# Patient Record
Sex: Female | Born: 1965
Health system: Southern US, Community
[De-identification: ages and names within clinical notes are randomized; demographics above are authoritative.]

## PROBLEM LIST (undated history)

## (undated) DIAGNOSIS — E079 Disorder of thyroid, unspecified: Secondary | ICD-10-CM

## (undated) DIAGNOSIS — D692 Other nonthrombocytopenic purpura: Secondary | ICD-10-CM

## (undated) DIAGNOSIS — I2510 Atherosclerotic heart disease of native coronary artery without angina pectoris: Secondary | ICD-10-CM

## (undated) DIAGNOSIS — E039 Hypothyroidism, unspecified: Secondary | ICD-10-CM

## (undated) DIAGNOSIS — I739 Peripheral vascular disease, unspecified: Secondary | ICD-10-CM

## (undated) DIAGNOSIS — I10 Essential (primary) hypertension: Secondary | ICD-10-CM

## (undated) DIAGNOSIS — E785 Hyperlipidemia, unspecified: Secondary | ICD-10-CM

## (undated) DIAGNOSIS — E1151 Type 2 diabetes mellitus with diabetic peripheral angiopathy without gangrene: Secondary | ICD-10-CM

## (undated) DIAGNOSIS — N289 Disorder of kidney and ureter, unspecified: Secondary | ICD-10-CM

## (undated) DIAGNOSIS — R7989 Other specified abnormal findings of blood chemistry: Secondary | ICD-10-CM

## (undated) DIAGNOSIS — E782 Mixed hyperlipidemia: Secondary | ICD-10-CM

## (undated) HISTORY — DX: Atherosclerotic heart disease of native coronary artery without angina pectoris: I25.10

## (undated) HISTORY — PX: HIP SURGERY: SHX245

## (undated) HISTORY — DX: Disorder of kidney and ureter, unspecified: N28.9

## (undated) HISTORY — PX: KNEE SURGERY: SHX244

## (undated) HISTORY — DX: Type 2 diabetes mellitus with diabetic peripheral angiopathy without gangrene: E11.51

## (undated) HISTORY — DX: Other specified abnormal findings of blood chemistry: R79.89

## (undated) HISTORY — DX: Mixed hyperlipidemia: E78.2

## (undated) HISTORY — DX: Peripheral vascular disease, unspecified: I73.9

## (undated) HISTORY — DX: Other nonthrombocytopenic purpura: D69.2

## (undated) HISTORY — PX: TUBAL LIGATION: SHX77

## (undated) HISTORY — DX: Hypothyroidism, unspecified: E03.9

---

## 1994-08-08 HISTORY — PX: HIP SURGERY: SHX245

## 2016-12-27 DIAGNOSIS — R05 Cough: Secondary | ICD-10-CM | POA: Diagnosis not present

## 2016-12-27 DIAGNOSIS — R062 Wheezing: Secondary | ICD-10-CM | POA: Diagnosis not present

## 2017-11-21 DIAGNOSIS — E782 Mixed hyperlipidemia: Secondary | ICD-10-CM | POA: Diagnosis not present

## 2017-11-21 DIAGNOSIS — E039 Hypothyroidism, unspecified: Secondary | ICD-10-CM | POA: Diagnosis not present

## 2017-11-28 DIAGNOSIS — E039 Hypothyroidism, unspecified: Secondary | ICD-10-CM | POA: Diagnosis not present

## 2017-11-28 DIAGNOSIS — E782 Mixed hyperlipidemia: Secondary | ICD-10-CM | POA: Diagnosis not present

## 2018-01-16 DIAGNOSIS — E039 Hypothyroidism, unspecified: Secondary | ICD-10-CM | POA: Diagnosis not present

## 2018-03-29 DIAGNOSIS — E782 Mixed hyperlipidemia: Secondary | ICD-10-CM | POA: Diagnosis not present

## 2018-03-29 DIAGNOSIS — E039 Hypothyroidism, unspecified: Secondary | ICD-10-CM | POA: Diagnosis not present

## 2018-07-10 DIAGNOSIS — E782 Mixed hyperlipidemia: Secondary | ICD-10-CM | POA: Diagnosis not present

## 2018-07-10 DIAGNOSIS — E039 Hypothyroidism, unspecified: Secondary | ICD-10-CM | POA: Diagnosis not present

## 2018-07-10 DIAGNOSIS — I350 Nonrheumatic aortic (valve) stenosis: Secondary | ICD-10-CM | POA: Diagnosis not present

## 2019-05-11 ENCOUNTER — Encounter (HOSPITAL_COMMUNITY)
Admission: EM | Disposition: A | Discharge status: Home / Self Care | Payer: Self-pay | Source: Home / Self Care | Attending: Cardiology

## 2019-05-11 ENCOUNTER — Encounter (HOSPITAL_COMMUNITY): Payer: Self-pay | Admitting: *Deleted

## 2019-05-11 ENCOUNTER — Inpatient Hospital Stay (HOSPITAL_COMMUNITY)
Admission: EM | Admit: 2019-05-11 | Discharge: 2019-05-21 | DRG: 234 | Disposition: A | Discharge status: Home / Self Care | Payer: 59 | Attending: Cardiothoracic Surgery | Admitting: Cardiothoracic Surgery

## 2019-05-11 ENCOUNTER — Other Ambulatory Visit: Payer: Self-pay

## 2019-05-11 DIAGNOSIS — E782 Mixed hyperlipidemia: Secondary | ICD-10-CM | POA: Diagnosis not present

## 2019-05-11 DIAGNOSIS — E785 Hyperlipidemia, unspecified: Secondary | ICD-10-CM | POA: Diagnosis present

## 2019-05-11 DIAGNOSIS — Z951 Presence of aortocoronary bypass graft: Secondary | ICD-10-CM | POA: Diagnosis not present

## 2019-05-11 DIAGNOSIS — R0789 Other chest pain: Secondary | ICD-10-CM | POA: Diagnosis present

## 2019-05-11 DIAGNOSIS — Z8249 Family history of ischemic heart disease and other diseases of the circulatory system: Secondary | ICD-10-CM | POA: Diagnosis not present

## 2019-05-11 DIAGNOSIS — I25110 Atherosclerotic heart disease of native coronary artery with unstable angina pectoris: Secondary | ICD-10-CM | POA: Diagnosis present

## 2019-05-11 DIAGNOSIS — I25118 Atherosclerotic heart disease of native coronary artery with other forms of angina pectoris: Secondary | ICD-10-CM | POA: Diagnosis not present

## 2019-05-11 DIAGNOSIS — J9811 Atelectasis: Secondary | ICD-10-CM

## 2019-05-11 DIAGNOSIS — R000 Tachycardia, unspecified: Secondary | ICD-10-CM | POA: Diagnosis not present

## 2019-05-11 DIAGNOSIS — I10 Essential (primary) hypertension: Secondary | ICD-10-CM

## 2019-05-11 DIAGNOSIS — Z79890 Hormone replacement therapy (postmenopausal): Secondary | ICD-10-CM | POA: Diagnosis not present

## 2019-05-11 DIAGNOSIS — I2102 ST elevation (STEMI) myocardial infarction involving left anterior descending coronary artery: Principal | ICD-10-CM | POA: Diagnosis present

## 2019-05-11 DIAGNOSIS — I2510 Atherosclerotic heart disease of native coronary artery without angina pectoris: Secondary | ICD-10-CM | POA: Diagnosis not present

## 2019-05-11 DIAGNOSIS — D62 Acute posthemorrhagic anemia: Secondary | ICD-10-CM | POA: Diagnosis not present

## 2019-05-11 DIAGNOSIS — I2109 ST elevation (STEMI) myocardial infarction involving other coronary artery of anterior wall: Secondary | ICD-10-CM

## 2019-05-11 DIAGNOSIS — Z87891 Personal history of nicotine dependence: Secondary | ICD-10-CM

## 2019-05-11 DIAGNOSIS — Z20828 Contact with and (suspected) exposure to other viral communicable diseases: Secondary | ICD-10-CM | POA: Diagnosis present

## 2019-05-11 DIAGNOSIS — R7303 Prediabetes: Secondary | ICD-10-CM | POA: Diagnosis present

## 2019-05-11 DIAGNOSIS — Z01818 Encounter for other preprocedural examination: Secondary | ICD-10-CM

## 2019-05-11 DIAGNOSIS — Z01810 Encounter for preprocedural cardiovascular examination: Secondary | ICD-10-CM | POA: Diagnosis not present

## 2019-05-11 DIAGNOSIS — E8770 Fluid overload, unspecified: Secondary | ICD-10-CM | POA: Diagnosis not present

## 2019-05-11 DIAGNOSIS — E039 Hypothyroidism, unspecified: Secondary | ICD-10-CM | POA: Diagnosis not present

## 2019-05-11 DIAGNOSIS — Z7722 Contact with and (suspected) exposure to environmental tobacco smoke (acute) (chronic): Secondary | ICD-10-CM | POA: Diagnosis present

## 2019-05-11 DIAGNOSIS — I5021 Acute systolic (congestive) heart failure: Secondary | ICD-10-CM | POA: Diagnosis not present

## 2019-05-11 DIAGNOSIS — I249 Acute ischemic heart disease, unspecified: Secondary | ICD-10-CM

## 2019-05-11 HISTORY — DX: Disorder of thyroid, unspecified: E07.9

## 2019-05-11 HISTORY — DX: Essential (primary) hypertension: I10

## 2019-05-11 HISTORY — PX: LEFT HEART CATH AND CORONARY ANGIOGRAPHY: CATH118249

## 2019-05-11 HISTORY — DX: ST elevation (STEMI) myocardial infarction involving left anterior descending coronary artery: I21.02

## 2019-05-11 HISTORY — DX: Hyperlipidemia, unspecified: E78.5

## 2019-05-11 LAB — CBC WITH DIFFERENTIAL/PLATELET
Abs Immature Granulocytes: 0.05 10*3/uL (ref 0.00–0.07)
Basophils Absolute: 0 10*3/uL (ref 0.0–0.1)
Basophils Relative: 0 %
Eosinophils Absolute: 0 10*3/uL (ref 0.0–0.5)
Eosinophils Relative: 0 %
HCT: 42.5 % (ref 36.0–46.0)
Hemoglobin: 14.3 g/dL (ref 12.0–15.0)
Immature Granulocytes: 1 %
Lymphocytes Relative: 17 %
Lymphs Abs: 1.8 10*3/uL (ref 0.7–4.0)
MCH: 31.8 pg (ref 26.0–34.0)
MCHC: 33.6 g/dL (ref 30.0–36.0)
MCV: 94.7 fL (ref 80.0–100.0)
Monocytes Absolute: 0.4 10*3/uL (ref 0.1–1.0)
Monocytes Relative: 4 %
Neutro Abs: 8.7 10*3/uL — ABNORMAL HIGH (ref 1.7–7.7)
Neutrophils Relative %: 78 %
Platelets: 340 10*3/uL (ref 150–400)
RBC: 4.49 MIL/uL (ref 3.87–5.11)
RDW: 13.8 % (ref 11.5–15.5)
WBC: 11 10*3/uL — ABNORMAL HIGH (ref 4.0–10.5)
nRBC: 0 % (ref 0.0–0.2)

## 2019-05-11 LAB — COMPREHENSIVE METABOLIC PANEL
ALT: 45 U/L — ABNORMAL HIGH (ref 0–44)
AST: 158 U/L — ABNORMAL HIGH (ref 15–41)
Albumin: 4.3 g/dL (ref 3.5–5.0)
Alkaline Phosphatase: 68 U/L (ref 38–126)
Anion gap: 11 (ref 5–15)
BUN: 14 mg/dL (ref 6–20)
CO2: 22 mmol/L (ref 22–32)
Calcium: 9.8 mg/dL (ref 8.9–10.3)
Chloride: 107 mmol/L (ref 98–111)
Creatinine, Ser: 0.75 mg/dL (ref 0.44–1.00)
GFR calc Af Amer: >60 mL/min (ref >60)
GFR calc non Af Amer: >60 mL/min (ref >60)
Glucose, Bld: 108 mg/dL — ABNORMAL HIGH (ref 70–99)
Potassium: 4 mmol/L (ref 3.5–5.1)
Sodium: 140 mmol/L (ref 135–145)
Total Bilirubin: 0.3 mg/dL (ref 0.3–1.2)
Total Protein: 7.2 g/dL (ref 6.5–8.1)

## 2019-05-11 LAB — CBC
HCT: 42 % (ref 36.0–46.0)
Hemoglobin: 13.6 g/dL (ref 12.0–15.0)
MCH: 31.1 pg (ref 26.0–34.0)
MCHC: 32.4 g/dL (ref 30.0–36.0)
MCV: 95.9 fL (ref 80.0–100.0)
Platelets: 352 10*3/uL (ref 150–400)
RBC: 4.38 MIL/uL (ref 3.87–5.11)
RDW: 14 % (ref 11.5–15.5)
WBC: 9.9 10*3/uL (ref 4.0–10.5)
nRBC: 0 % (ref 0.0–0.2)

## 2019-05-11 LAB — LIPID PANEL
Cholesterol: 93 mg/dL (ref 0–200)
HDL: 25 mg/dL — ABNORMAL LOW (ref >40)
LDL Cholesterol: 54 mg/dL (ref 0–99)
Total CHOL/HDL Ratio: 3.7 RATIO
Triglycerides: 70 mg/dL (ref <150)
VLDL: 14 mg/dL (ref 0–40)

## 2019-05-11 LAB — TSH: TSH: 0.941 u[IU]/mL (ref 0.350–4.500)

## 2019-05-11 LAB — HIV ANTIBODY (ROUTINE TESTING W REFLEX): HIV Screen 4th Generation wRfx: NONREACTIVE

## 2019-05-11 LAB — BASIC METABOLIC PANEL
Anion gap: 10 (ref 5–15)
BUN: 13 mg/dL (ref 6–20)
CO2: 23 mmol/L (ref 22–32)
Calcium: 9.6 mg/dL (ref 8.9–10.3)
Chloride: 106 mmol/L (ref 98–111)
Creatinine, Ser: 0.68 mg/dL (ref 0.44–1.00)
GFR calc Af Amer: >60 mL/min (ref >60)
GFR calc non Af Amer: >60 mL/min (ref >60)
Glucose, Bld: 122 mg/dL — ABNORMAL HIGH (ref 70–99)
Potassium: 4.1 mmol/L (ref 3.5–5.1)
Sodium: 139 mmol/L (ref 135–145)

## 2019-05-11 LAB — I-STAT BETA HCG BLOOD, ED (MC, WL, AP ONLY): I-stat hCG, quantitative: <5 m[IU]/mL (ref <5)

## 2019-05-11 LAB — MAGNESIUM: Magnesium: 1.8 mg/dL (ref 1.7–2.4)

## 2019-05-11 LAB — PROTIME-INR
INR: 1.1 (ref 0.8–1.2)
Prothrombin Time: 13.6 seconds (ref 11.4–15.2)

## 2019-05-11 LAB — APTT: aPTT: 32 seconds (ref 24–36)

## 2019-05-11 LAB — HEMOGLOBIN A1C
Hgb A1c MFr Bld: 6.2 % — ABNORMAL HIGH (ref 4.8–5.6)
Mean Plasma Glucose: 131.24 mg/dL

## 2019-05-11 LAB — MRSA PCR SCREENING: MRSA by PCR: NEGATIVE

## 2019-05-11 LAB — BRAIN NATRIURETIC PEPTIDE: B Natriuretic Peptide: 265.6 pg/mL — ABNORMAL HIGH (ref 0.0–100.0)

## 2019-05-11 LAB — TROPONIN I (HIGH SENSITIVITY)
Troponin I (High Sensitivity): >27000 ng/L (ref <18)
Troponin I (High Sensitivity): >27000 ng/L (ref <18)

## 2019-05-11 LAB — SARS CORONAVIRUS 2 BY RT PCR (HOSPITAL ORDER, PERFORMED IN ~~LOC~~ HOSPITAL LAB): SARS Coronavirus 2: NEGATIVE

## 2019-05-11 SURGERY — LEFT HEART CATH AND CORONARY ANGIOGRAPHY
Anesthesia: LOCAL

## 2019-05-11 MED ORDER — FENOFIBRATE 54 MG PO TABS
54.0000 mg | ORAL_TABLET | Freq: Every day | ORAL | Status: DC
  Administered 2019-05-11 – 2019-05-16 (×6): 54 mg via ORAL
  Filled 2019-05-11 (×6): qty 1

## 2019-05-11 MED ORDER — IOHEXOL 350 MG/ML SOLN
INTRAVENOUS | Status: DC | PRN
  Administered 2019-05-11: 15:00:00 100 mL via INTRA_ARTERIAL

## 2019-05-11 MED ORDER — MUPIROCIN 2 % EX OINT: 1.0000 "application " | TOPICAL_OINTMENT | Freq: Two times a day (BID) | CUTANEOUS | Status: DC

## 2019-05-11 MED ORDER — NITROGLYCERIN 1 MG/10 ML FOR IR/CATH LAB
INTRA_ARTERIAL | Status: AC
  Filled 2019-05-11: qty 10

## 2019-05-11 MED ORDER — VERAPAMIL HCL 2.5 MG/ML IV SOLN
INTRAVENOUS | Status: DC | PRN
  Administered 2019-05-11: 15:00:00 10 mL via INTRA_ARTERIAL

## 2019-05-11 MED ORDER — CARVEDILOL 6.25 MG PO TABS
6.2500 mg | ORAL_TABLET | Freq: Two times a day (BID) | ORAL | Status: DC
  Administered 2019-05-11 – 2019-05-16 (×11): 6.25 mg via ORAL
  Filled 2019-05-11 (×12): qty 1

## 2019-05-11 MED ORDER — HEPARIN (PORCINE) IN NACL 1000-0.9 UT/500ML-% IV SOLN
INTRAVENOUS | Status: AC
  Filled 2019-05-11: qty 1000

## 2019-05-11 MED ORDER — ASPIRIN 81 MG PO CHEW
81.0000 mg | CHEWABLE_TABLET | Freq: Every day | ORAL | Status: DC
  Administered 2019-05-12 – 2019-05-16 (×5): 81 mg via ORAL
  Filled 2019-05-11 (×5): qty 1

## 2019-05-11 MED ORDER — HEPARIN SODIUM (PORCINE) 1000 UNIT/ML IJ SOLN
INTRAMUSCULAR | Status: DC | PRN
  Administered 2019-05-11: 3000 [IU] via INTRAVENOUS

## 2019-05-11 MED ORDER — EZETIMIBE 10 MG PO TABS
10.0000 mg | ORAL_TABLET | Freq: Every day | ORAL | Status: DC
  Administered 2019-05-11 – 2019-05-21 (×10): 10 mg via ORAL
  Filled 2019-05-11 (×10): qty 1

## 2019-05-11 MED ORDER — FENTANYL CITRATE (PF) 100 MCG/2ML IJ SOLN
INTRAMUSCULAR | Status: AC
  Filled 2019-05-11: qty 2

## 2019-05-11 MED ORDER — NITROGLYCERIN IN D5W 200-5 MCG/ML-% IV SOLN
INTRAVENOUS | Status: AC | PRN
  Administered 2019-05-11: 5 ug/min via INTRAVENOUS

## 2019-05-11 MED ORDER — SODIUM CHLORIDE 0.9 % IV SOLN
INTRAVENOUS | Status: DC
  Administered 2019-05-11 – 2019-05-17 (×2): via INTRAVENOUS

## 2019-05-11 MED ORDER — HEPARIN SODIUM (PORCINE) 1000 UNIT/ML IJ SOLN
INTRAMUSCULAR | Status: AC
  Filled 2019-05-11: qty 1

## 2019-05-11 MED ORDER — ASPIRIN 81 MG PO CHEW: 324.0000 mg | CHEWABLE_TABLET | Freq: Once | ORAL | Status: DC

## 2019-05-11 MED ORDER — LIDOCAINE HCL (PF) 1 % IJ SOLN
INTRAMUSCULAR | Status: DC | PRN
  Administered 2019-05-11: 5 mL via SUBCUTANEOUS

## 2019-05-11 MED ORDER — LIDOCAINE HCL (PF) 1 % IJ SOLN
INTRAMUSCULAR | Status: AC
  Filled 2019-05-11: qty 30

## 2019-05-11 MED ORDER — SODIUM CHLORIDE 0.9 % IV SOLN: 250.0000 mL | INTRAVENOUS | Status: DC | PRN

## 2019-05-11 MED ORDER — HEPARIN (PORCINE) IN NACL 1000-0.9 UT/500ML-% IV SOLN
INTRAVENOUS | Status: DC | PRN
  Administered 2019-05-11 (×2): 500 mL

## 2019-05-11 MED ORDER — SODIUM CHLORIDE 0.9% FLUSH: 3.0000 mL | Freq: Once | INTRAVENOUS | Status: DC

## 2019-05-11 MED ORDER — NITROGLYCERIN 0.4 MG SL SUBL
0.4000 mg | SUBLINGUAL_TABLET | SUBLINGUAL | Status: DC | PRN
  Administered 2019-05-11: 0.4 mg via SUBLINGUAL
  Filled 2019-05-11: qty 1

## 2019-05-11 MED ORDER — MIDAZOLAM HCL 2 MG/2ML IJ SOLN
INTRAMUSCULAR | Status: AC
  Filled 2019-05-11: qty 2

## 2019-05-11 MED ORDER — SODIUM CHLORIDE 0.9% FLUSH: 3.0000 mL | INTRAVENOUS | Status: DC | PRN

## 2019-05-11 MED ORDER — HEPARIN (PORCINE) 25000 UT/250ML-% IV SOLN
1250.0000 [IU]/h | INTRAVENOUS | Status: DC
  Administered 2019-05-11: 850 [IU]/h via INTRAVENOUS
  Administered 2019-05-13 – 2019-05-15 (×3): 1250 [IU]/h via INTRAVENOUS
  Filled 2019-05-11 (×6): qty 250

## 2019-05-11 MED ORDER — SODIUM CHLORIDE 0.9% FLUSH
3.0000 mL | Freq: Two times a day (BID) | INTRAVENOUS | Status: DC
  Administered 2019-05-12 – 2019-05-16 (×6): 3 mL via INTRAVENOUS

## 2019-05-11 MED ORDER — LEVOTHYROXINE SODIUM 100 MCG PO TABS
100.0000 ug | ORAL_TABLET | Freq: Every day | ORAL | Status: DC
  Administered 2019-05-12 – 2019-05-21 (×9): 100 ug via ORAL
  Filled 2019-05-11 (×9): qty 1

## 2019-05-11 MED ORDER — ACETAMINOPHEN 325 MG PO TABS: 650.0000 mg | ORAL_TABLET | ORAL | Status: DC | PRN

## 2019-05-11 MED ORDER — ASPIRIN EC 81 MG PO TBEC: 81.0000 mg | DELAYED_RELEASE_TABLET | Freq: Every day | ORAL | Status: DC

## 2019-05-11 MED ORDER — CHLORHEXIDINE GLUCONATE CLOTH 2 % EX PADS
6.0000 | MEDICATED_PAD | Freq: Every day | CUTANEOUS | Status: DC
  Administered 2019-05-11 – 2019-05-19 (×6): 6 via TOPICAL

## 2019-05-11 MED ORDER — ONDANSETRON HCL 4 MG/2ML IJ SOLN
4.0000 mg | Freq: Four times a day (QID) | INTRAMUSCULAR | Status: DC | PRN
  Administered 2019-05-17: 13:00:00 4 mg via INTRAVENOUS

## 2019-05-11 MED ORDER — NITROGLYCERIN 0.4 MG SL SUBL: 0.4000 mg | SUBLINGUAL_TABLET | SUBLINGUAL | Status: DC | PRN

## 2019-05-11 MED ORDER — ATORVASTATIN CALCIUM 80 MG PO TABS
80.0000 mg | ORAL_TABLET | Freq: Every day | ORAL | Status: DC
  Administered 2019-05-11 – 2019-05-20 (×10): 80 mg via ORAL
  Filled 2019-05-11 (×9): qty 1

## 2019-05-11 MED ORDER — NITROGLYCERIN IN D5W 200-5 MCG/ML-% IV SOLN
0.0000 ug/min | INTRAVENOUS | Status: DC
  Administered 2019-05-11: 16:00:00 5 ug/min via INTRAVENOUS

## 2019-05-11 MED ORDER — HEPARIN SODIUM (PORCINE) 5000 UNIT/ML IJ SOLN
60.0000 [IU]/kg | Freq: Once | INTRAMUSCULAR | Status: AC
  Administered 2019-05-11: 14:00:00 3800 [IU] via INTRAVENOUS
  Filled 2019-05-11: qty 1

## 2019-05-11 MED ORDER — SODIUM CHLORIDE 0.9 % WEIGHT BASED INFUSION
1.0000 mL/kg/h | INTRAVENOUS | Status: DC
  Administered 2019-05-11: 16:00:00 1 mL/kg/h via INTRAVENOUS

## 2019-05-11 SURGICAL SUPPLY — 18 items
CATH 5FR JL3.5 JR4 ANG PIG MP (CATHETERS) ×2 IMPLANT
CATH LAUNCHER 5F JL3 (CATHETERS) ×1 IMPLANT
CATH LAUNCHER 5F RADL (CATHETERS) ×1 IMPLANT
CATH VISTA GUIDE 6FR XBLAD3.5 (CATHETERS) ×2 IMPLANT
CATHETER LAUNCHER 5F JL3 (CATHETERS) ×2
CATHETER LAUNCHER 5F RADL (CATHETERS) ×2
COVER PRB 48X5XTLSCP FOLD TPE (BAG) ×1 IMPLANT
COVER PROBE 5X48 (BAG) ×1
DEVICE RAD COMP TR BAND LRG (VASCULAR PRODUCTS) ×2 IMPLANT
GLIDESHEATH SLEND SS 6F .021 (SHEATH) ×2 IMPLANT
GUIDEWIRE INQWIRE 1.5J.035X260 (WIRE) ×1 IMPLANT
INQWIRE 1.5J .035X260CM (WIRE) ×2
KIT ENCORE 26 ADVANTAGE (KITS) ×2 IMPLANT
KIT HEART LEFT (KITS) ×2 IMPLANT
PACK CARDIAC CATHETERIZATION (CUSTOM PROCEDURE TRAY) ×2 IMPLANT
SYR MEDRAD MARK 7 150ML (SYRINGE) ×2 IMPLANT
TRANSDUCER W/STOPCOCK (MISCELLANEOUS) ×2 IMPLANT
TUBING CIL FLEX 10 FLL-RA (TUBING) ×2 IMPLANT

## 2019-05-11 NOTE — Plan of Care (Signed)

## 2019-05-11 NOTE — ED Triage Notes (Signed)
Pt began having chest pain radiating to back on Thurs, but MD could not see her and pain resolved.  Pain increased today and radiated to L arm.  Seen at [facility] and given 325 asa and 3 nitro that resolved pain.

## 2019-05-11 NOTE — ED Provider Notes (Signed)
[facility] EMERGENCY DEPARTMENT Provider Note   CSN: [id number] Arrival date & time: 05/11/19  1242     History   Chief Complaint Chief Complaint  Patient presents with  . Chest Pain    HPI Maria Zuniga is a 53 y.o. female with a history of hypertension, hyperlipidemia, significant family history of cardiac disease, former smoker quit 2 years ago, presented to emergency department with chest pain left arm pain.  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  She said the pain was persistent and so she went to urgent care, where she was given full dose aspirin as well as sublingual nitroglycerin, and her pain thereafter immediately resolved.  She reports has been having left shoulder pain for approximately 2 weeks prior to this.   Here in the emergency department she is completely asymptomatic.  She reports she has not been seen by a heart doctor in many years.  She does not recall any recent stress test.  She has no known drug allergies.     HPI  Past Medical History:  Diagnosis Date  . Hyperlipidemia   . Hypertension   . Thyroid disease     Patient Active Problem List   Diagnosis Date Noted  . STEMI involving left anterior descending coronary artery (HCC) 05/11/2019  . Hyperlipidemia 05/11/2019  . HTN (hypertension) 05/11/2019  . Hypothyroidism 05/11/2019    Past Surgical History:  Procedure Laterality Date  . HIP SURGERY    . KNEE SURGERY    . TUBAL LIGATION       OB History   No obstetric history on file.      Home Medications    Prior to Admission medications   Medication Sig Start Date End Date Taking? Authorizing Provider  atorvastatin (LIPITOR) 40 MG tablet Take 40 mg by mouth daily.   Yes [provider]  ezetimibe (ZETIA) 10 MG tablet Take 10 mg by mouth daily.   Yes [provider]  fenofibrate 54 MG  tablet Take 54 mg by mouth daily.   Yes [provider]  levothyroxine (SYNTHROID) 100 MCG tablet Take 100 mcg by mouth daily before breakfast.   Yes [provider]  furosemide (LASIX) 20 MG tablet Take 20 mg by mouth daily as needed for fluid or edema.    [provider]    Family History Family History  Problem Relation Age of Onset  . CAD Mother   . Heart attack Brother   . Heart disease Maternal Grandmother   . Heart disease Maternal Grandfather   . Heart disease Paternal Grandmother     Social History Social History   Tobacco Use  . Smoking status: Former Smoker    Types: Cigarettes    Start date: 05/10/2018  . Smokeless tobacco: Never Used  Substance Use Topics  . Alcohol use: Not Currently  . Drug use: Never     Allergies   Patient has no known allergies.   Review of Systems Review of Systems  Constitutional: Negative for chills and fever.  HENT: Negative for ear pain and sore throat.   Respiratory: Negative for cough and shortness of breath.   Cardiovascular: Positive for chest pain. Negative for palpitations.  Gastrointestinal: Negative for abdominal pain and vomiting.  Genitourinary: Negative for hematuria.  Musculoskeletal: Positive for arthralgias, back pain, joint swelling and myalgias.  Skin: Negative for  color change and rash.  Neurological: Negative for dizziness, syncope and light-headedness.  All other systems reviewed and are negative.    Physical Exam Updated Vital Signs BP 124/70   Pulse 96   Temp 99 F (37.2 C) (Oral)   Resp (!) 21   Ht 5\' 3"  (1.6 m)   Wt 63.5 kg   SpO2 94%   BMI 24.80 kg/m   Physical Exam Vitals signs and nursing note reviewed.  Constitutional:      General: She is not in acute distress.    Appearance: She is well-developed.  HENT:     Head: Normocephalic and atraumatic.  Eyes:     Conjunctiva/sclera: Conjunctivae normal.  Neck:     Musculoskeletal: Neck supple.  Cardiovascular:      Rate and Rhythm: Normal rate and regular rhythm.     Heart sounds: Normal heart sounds.  Pulmonary:     Effort: Pulmonary effort is normal. No respiratory distress.     Breath sounds: Normal breath sounds.  Abdominal:     Palpations: Abdomen is soft.     Tenderness: There is no abdominal tenderness.  Skin:    General: Skin is warm and dry.  Neurological:     Mental Status: She is alert.      ED Treatments / Results  Labs (all labs ordered are listed, but only abnormal results are displayed) Labs Reviewed  BASIC METABOLIC PANEL - Abnormal; Notable for the following components:      Result Value   Glucose, Bld 122 (*)    All other components within normal limits  CBC WITH DIFFERENTIAL/PLATELET - Abnormal; Notable for the following components:   WBC 11.0 (*)    Neutro Abs 8.7 (*)    All other components within normal limits  COMPREHENSIVE METABOLIC PANEL - Abnormal; Notable for the following components:   Glucose, Bld 108 (*)    AST 158 (*)    ALT 45 (*)    All other components within normal limits  LIPID PANEL - Abnormal; Notable for the following components:   HDL 25 (*)    All other components within normal limits  HEMOGLOBIN A1C - Abnormal; Notable for the following components:   Hgb A1c MFr Bld 6.2 (*)    All other components within normal limits  BRAIN NATRIURETIC PEPTIDE - Abnormal; Notable for the following components:   B Natriuretic Peptide 265.6 (*)    All other components within normal limits  TROPONIN I (HIGH SENSITIVITY) - Abnormal; Notable for the following components:   Troponin I (High Sensitivity) >27,000 (*)    All other components within normal limits  TROPONIN I (HIGH SENSITIVITY) - Abnormal; Notable for the following components:   Troponin I (High Sensitivity) >27,000 (*)    All other components within normal limits  SARS CORONAVIRUS 2 (HOSPITAL ORDER, [facility])  MRSA PCR SCREENING  SURGICAL PCR SCREEN  CBC   PROTIME-INR  APTT  HIV ANTIBODY (ROUTINE TESTING W REFLEX)  TSH  MAGNESIUM  HIV4GL SAVE TUBE  BASIC METABOLIC PANEL  CBC  HEPARIN LEVEL (UNFRACTIONATED)  I-STAT BETA HCG BLOOD, ED ([facility], [facility], [facility] ONLY)    EKG EKG Interpretation  Date/Time:  Saturday May 11 2019 13:26:19 EDT Ventricular Rate:  86 PR Interval:  144 QRS Duration: 118 QT Interval:  417 QTC Calculation: 499 R Axis:   -2 Text Interpretation:  Sinus rhythm LVH with secondary repolarization abnormality Anterior infarct, recent Repeat ECG Continued concern for wellen's LAD lesion  STEMI Alert  Confirmed by Alvester Chou [phone]) on 05/11/2019 1:41:05 PM   Radiology No results found.  Procedures .Critical Care Performed by: Terald Sleeper, MD Authorized by: Terald Sleeper, MD   Critical care provider statement:    Critical care time (minutes):  45   Critical care was time spent personally by me on the following activities:  Discussions with consultants, evaluation of patient's response to treatment, examination of patient, ordering and performing treatments and interventions, ordering and review of laboratory studies, ordering and review of radiographic studies, pulse oximetry, re-evaluation of patient's condition, obtaining history from patient or surrogate and review of old charts   (including critical care time)  Medications Ordered in ED Medications  0.9 %  sodium chloride infusion ( Intravenous Stopped 05/11/19 1604)  nitroGLYCERIN (NITROSTAT) SL tablet 0.4 mg ( Sublingual MAR Unhold 05/11/19 1537)  ezetimibe (ZETIA) tablet 10 mg (10 mg Oral Given 05/11/19 1707)  fenofibrate tablet 54 mg (54 mg Oral Given 05/11/19 1735)  levothyroxine (SYNTHROID) tablet 100 mcg (has no administration in time range)  acetaminophen (TYLENOL) tablet 650 mg (has no administration in time range)  ondansetron (ZOFRAN) injection 4 mg (has no administration in time range)  sodium chloride flush (NS) 0.9 % injection 3 mL (has no  administration in time range)  sodium chloride flush (NS) 0.9 % injection 3 mL (has no administration in time range)  0.9 %  sodium chloride infusion (has no administration in time range)  atorvastatin (LIPITOR) tablet 80 mg (80 mg Oral Given 05/11/19 1707)  aspirin chewable tablet 81 mg (has no administration in time range)  nitroGLYCERIN 50 mg in dextrose 5 % 250 mL (0.2 mg/mL) infusion (30 mcg/min Intravenous Rate/Dose Change 05/11/19 2015)  carvedilol (COREG) tablet 6.25 mg (6.25 mg Oral Given 05/11/19 1707)  heparin ADULT infusion 100 units/mL (25000 units/244mL sodium chloride 0.45%) (has no administration in time range)  Chlorhexidine Gluconate Cloth 2 % PADS 6 each (6 each Topical Given 05/11/19 1600)  mupirocin ointment (BACTROBAN) 2 % 1 application (has no administration in time range)  heparin injection 3,800 Units (3,800 Units Intravenous Given 05/11/19 1346)  nitroGLYCERIN 50 mg in dextrose 5 % 250 mL (0.2 mg/mL) infusion (5 mcg/min Intravenous New Bag/Given 05/11/19 1530)  lidocaine (PF) (XYLOCAINE) 1 % injection (has no administration in time range)  Heparin (Porcine) in NaCl 1000-0.9 UT/500ML-% SOLN (has no administration in time range)  nitroGLYCERIN 100 mcg/mL intra-arterial injection (has no administration in time range)  midazolam (VERSED) 2 MG/2ML injection (has no administration in time range)  fentaNYL (SUBLIMAZE) 100 MCG/2ML injection (has no administration in time range)  heparin 1000 UNIT/ML injection (has no administration in time range)  nitroGLYCERIN 100 mcg/mL intra-arterial injection (has no administration in time range)     Initial Impression / Assessment and Plan / ED Course  I have reviewed the triage vital signs and the nursing notes.  Pertinent labs & imaging results that were available during my care of the patient were reviewed by me and considered in my medical decision making (see chart for details).  53 yo female presenting with left chest and shoulder  pain that began while at work this morning around 9 am.  Symptoms resolved with aspirin and SL nitro at urgent care.  Here in ED she is asymptomatic.  EKG concerning for Wellen's/LAD occlusion.  STEMI alert on arrival. Already received full dose aspirin prior to arrival. Will send labs, talk to cardiology NPO    Clinical Course as of  May 10 2254  Sat May 11, 2019  1333 STEMI alert   [MT]  1343 EKG pattern concerning for Wellens syndrome, concerning for LAD lesion.  Awaiting cardiology.   [MT]  1343 Patient already received full dose aspirin in the field.  She is asymptomatic.   [MT]  1355 Spoke to Dr. Peter SwazilandJordan of interventional cardiology, they will activate the cath lab   [MT]    Clinical Course User Index [MT] Kena Limon, Kermit BaloMatthew J, MD     Final Clinical Impressions(s) / ED Diagnoses   Final diagnoses:  ACS (acute coronary syndrome) [facility](HCC)    ED Discharge Orders    None       Terald Sleeperrifan, Roshunda Keir J, MD 05/11/19 2256

## 2019-05-11 NOTE — ED Notes (Signed)
Cassie ([facility]/Activate Code Stemi)-per Petra Kuba, RN called by Levada Dy

## 2019-05-11 NOTE — Consult Note (Signed)
[address]       [place] [address]             [phone]        Kourtni Stineman [facility] Medical Record #[id number] Date of Birth: January 20, 1966  Referring: Dr Swaziland Primary Care: No primary care provider on file. Primary Cardiologist:No primary care provider on file.  Chief Complaint:    Chief Complaint  Patient presents with  . Chest Pain    History of Present Illness:     Patient is a 53 year old female with previous history of hypertension and hyperlipidemia.  For the past 2 weeks the patient's been having stuttering episodes of chest discomfort and shoulder pain intermittently.  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  She went to urgent care where she was given aspirin and nitroglycerin and noted that her pain resolved she was then transferred to [facility]. Troponin elevated at greater than 27,000.  She went to cardiac cath today.  Dr. Swaziland requested consultation to consider coronary bypass grafting.   Current Activity/ Functional Status: Patient is independent with mobility/ambulation, transfers, ADL's, IADL's.   Zubrod Score: At the time of surgery this patient's most appropriate activity status/level should be described as: [x]     0    Normal activity, no symptoms, until last two weeks []     1    Restricted in physical strenuous activity but ambulatory, able to do out light work []     2    Ambulatory and capable of self care, unable to do work activities, up and about                 more than 50%  Of the time                            []     3    Only limited self care, in bed greater than 50% of waking hours []     4    Completely disabled, no self care, confined to bed or chair []     5    Moribund  Past Medical History:  Diagnosis Date  . Hyperlipidemia   . Hypertension   . Thyroid disease     Past Surgical History:  Procedure Laterality Date  . HIP SURGERY    . KNEE SURGERY    . TUBAL LIGATION      Social History   Tobacco Use  Smoking Status Former Smoker  . Types: Cigarettes  . Start date: 05/10/2018  Smokeless Tobacco Never Used    Social History   Substance and Sexual Activity  Alcohol Use Not Currently     No Known Allergies  Current Facility-Administered Medications  Medication Dose Route Frequency Provider Last Rate Last Dose  . 0.9 %  sodium chloride infusion   Intravenous Continuous , Peter M, MD   Stopped at 05/11/19 1604  . [START ON 05/12/2019] 0.9 %  sodium chloride infusion  250 mL Intravenous PRN , Peter M, MD      . 0.9% sodium chloride infusion  1 mL/kg/hr Intravenous Continuous , Peter M, MD 63.5 mL/hr at 05/11/19 1604 1 mL/kg/hr at 05/11/19 1604  . acetaminophen (TYLENOL) tablet 650 mg  650 mg Oral Q4H PRN 07/11/19, Peter M, MD      . [  START ON 05/12/2019] aspirin chewable tablet 81 mg  81 mg Oral Daily [place], Peter M, MD      . atorvastatin (LIPITOR) tablet 80 mg  80 mg Oral q1800 [place], Peter M, MD   80 mg at 05/11/19 1707  . carvedilol (COREG) tablet 6.25 mg  6.25 mg Oral BID WC [place], Peter M, MD   6.25 mg at 05/11/19 1707  . Chlorhexidine Gluconate Cloth 2 % PADS 6 each  6 each Topical Daily [place], Peter M, MD   6 each at 05/11/19 1600  . ezetimibe (ZETIA) tablet 10 mg  10 mg Oral Daily [place], Peter M, MD   10 mg at 05/11/19 1707  . fenofibrate tablet 54 mg  54 mg Oral Daily [place], Peter M, MD   54 mg at 05/11/19 1735  . heparin ADULT infusion 100 units/mL (25000 units/243mL sodium chloride 0.45%)  850 Units/hr Intravenous Continuous Kris Mouton, [facility]      . [START ON 05/12/2019] levothyroxine (SYNTHROID) tablet 100 mcg  100 mcg Oral QAC breakfast [place], Peter M, MD      . nitroGLYCERIN (NITROSTAT) SL tablet 0.4 mg  0.4 mg Sublingual Q5 Min x 3 PRN [place], Peter M, MD   Stopped at 05/11/19 1333  . nitroGLYCERIN 50 mg  in dextrose 5 % 250 mL (0.2 mg/mL) infusion  0-30 mcg/min Intravenous Titrated [place], Peter M, MD 1.5 mL/hr at 05/11/19 1605 5 mcg/min at 05/11/19 1605  . ondansetron (ZOFRAN) injection 4 mg  4 mg Intravenous Q6H PRN [place], Peter M, MD      . Derrill Memo ON 05/12/2019] sodium chloride flush (NS) 0.9 % injection 3 mL  3 mL Intravenous Q12H [place], Peter M, MD      . Derrill Memo ON 05/12/2019] sodium chloride flush (NS) 0.9 % injection 3 mL  3 mL Intravenous PRN [place], Peter M, MD        Medications Prior to Admission  Medication Sig Dispense Refill Last Dose  . atorvastatin (LIPITOR) 40 MG tablet Take 40 mg by mouth daily.   05/10/2019 at Unknown time  . ezetimibe (ZETIA) 10 MG tablet Take 10 mg by mouth daily.   05/10/2019 at Unknown time  . fenofibrate 54 MG tablet Take 54 mg by mouth daily.   05/10/2019 at Unknown time  . levothyroxine (SYNTHROID) 100 MCG tablet Take 100 mcg by mouth daily before breakfast.   05/11/2019 at Unknown time  . furosemide (LASIX) 20 MG tablet Take 20 mg by mouth daily as needed for fluid or edema.   Not Taking at Unknown time    Family History  Problem Relation Age of Onset  . CAD Mother   . Heart attack Brother   . Heart disease Maternal Grandmother   . Heart disease Maternal Grandfather   . Heart disease Paternal Grandmother      Review of Systems:      Cardiac Review of Systems: Y or  [    ]= no  Chest Pain Blue.Reese    ]  Resting SOB [ y  ] Exertional SOB  [  y]  Orthopnea [  ]   Pedal Edema [ n  ]    Palpitations [n  ] Syncope  [ n ]   Presyncope [ n  ]  General Review of Systems: [Y] = yes [  ]=no Constitional: recent weight change [  ]; anorexia [  ]; fatigue [  ]; nausea [  ]; night sweats [  ]; fever [  ];  or chills [  ]                                                               Dental: Last Dentist visit:   Eye : blurred vision [  ]; diplopia [   ]; vision changes [  ];  Amaurosis fugax[  ]; Resp: cough [  ];  wheezing[  ];  hemoptysis[  ]; shortness of  breath[  ]; paroxysmal nocturnal dyspnea[  ]; dyspnea on exertion[  ]; or orthopnea[  ];  GI:  gallstones[  ], vomiting[  ];  dysphagia[  ]; melena[  ];  hematochezia [  ]; heartburn[  ];   Hx of  Colonoscopy[  ]; GU: kidney stones [  ]; hematuria[  ];   dysuria [  ];  nocturia[  ];  history of     obstruction [  ]; urinary frequency [  ]             Skin: rash, swelling[  ];, hair loss[  ];  peripheral edema[  ];  or itching[  ]; Musculosketetal: myalgias[  ];  joint swelling[ y ];  joint erythema[  ];  joint pain[ y ];  back pain[y  ];  Heme/Lymph: bruising[  ];  bleeding[  ];  anemia[  ];  Neuro: TIA[  ];  headaches[  ];  stroke[  ];  vertigo[  ];  seizures[  ];   paresthesias[  ];  difficulty walking[  ];  Psych:depression[  ]; anxiety[  ];  Endocrine: diabetes[  ];  thyroid dysfunction[  ];               Physical Exam: BP (!) 149/70   Pulse 98   Temp 98.5 F (36.9 C) (Oral)   Resp (!) 8   Ht 5\' 3"  (1.6 m)   Wt 63.5 kg   SpO2 98%   BMI 24.80 kg/m    General appearance: alert, cooperative, appears stated age and no chest pain Head: Normocephalic, without obvious abnormality, atraumatic Neck: no adenopathy, no carotid bruit, no JVD, supple, symmetrical, trachea midline and thyroid not enlarged, symmetric, no tenderness/mass/nodules Lymph nodes: Cervical, supraclavicular, and axillary nodes normal. Resp: clear to auscultation bilaterally Cardio: regular rate and rhythm, S1, S2 normal, no murmur, click, rub or gallop GI: soft, non-tender; bowel sounds normal; no masses,  no organomegaly Extremities: extremities normal, atraumatic, no cyanosis or edema and palpable dp and pt pulses, right radial cath site Neurologic: Grossly normal  Diagnostic Studies & Laboratory data:     Recent Radiology Findings: no xrays done on admission  No results found.   Recent Lab Findings: Lab Results  Component Value Date   WBC 11.0 (H) 05/11/2019   HGB 14.3 05/11/2019   HCT 42.5 05/11/2019    PLT 340 05/11/2019   GLUCOSE 108 (H) 05/11/2019   CHOL 93 05/11/2019   TRIG 70 05/11/2019   HDL 25 (L) 05/11/2019   LDLCALC 54 05/11/2019   ALT 45 (H) 05/11/2019   AST 158 (H) 05/11/2019   NA 140 05/11/2019   K 4.0 05/11/2019   CL 107 05/11/2019   CREATININE 0.75 05/11/2019   BUN 14 05/11/2019   CO2 22 05/11/2019   TSH 0.941 05/11/2019   INR 1.1 05/11/2019  HGBA1C 6.2 (H) 05/11/2019    Troponin >27,000   Ost LM to Dist LM lesion is 75% stenosed.  Ost LAD lesion is 95% stenosed.  Ost Cx lesion is 60% stenosed.  Prox RCA lesion is 35% stenosed.  There is moderate left ventricular systolic dysfunction.  LV end diastolic pressure is moderately elevated.  The left ventricular ejection fraction is 35-45% by visual estimate.   1. Severe left main and critical ostial LAD stenosis. Severe dampening of pressures with any catheter engagement.  2. Moderately elevated LVDEP.  3. Moderate LV dysfunction. EF estimated at 35-40% with anteroapical wall motion abnormality.   Plan: will admit to ICU. Aggressive medical therapy. Will consult CT surgery for consideration of CABG.   Assessment / Plan:   1/ Subacute anterior MI - patient with stuttering chest pain for the past 10 days to 2 weeks which became more acute over the past 2 days.  Today the pain which had been resolving after 2 hours became persistent.  Troponins elevated at greater than 27,000 with mild liver enzyme elevation  At the time the patient was admitted and taken to the Cath Lab she was without ongoing chest pain.  Cath showed significant anterior apical hypo-and akinesis with probable myocardial infarction that had been ongoing for greater than 48 hours.-With the patient's critical anatomy and degree of left main disease coronary artery bypass grafting represents the best treatment option to prevent further loss of LV function.  2/controlled hypertension 3/hyperlipidemia 4/history of tobacco use, stopped smoking 2  years ago 5/hypothyroidism  Discussed with patient and her husband the findings cardiac cath and the recommendation for coronary artery bypass grafting, timing of this will be determined by clinical symptoms, degree of LV dysfunction on echocardiogram and follow-up, but would plan on coronary artery bypass grafting this admission .   Delight Ovens MD      [address] [place] [place] [address] Office [phone]   Beeper [phone]  05/11/2019 6:34 PM

## 2019-05-11 NOTE — H&P (Signed)
Cardiology Admission History and Physical:   Patient ID: Maria Zuniga MRN: [id number]; DOB: Oct 20, 1965   Admission date: 05/11/2019  Primary Care Provider: Dr Jackolyn Confer Primary Cardiologist: No primary care provider on file. New Primary Electrophysiologist:  None   Chief Complaint:  Chest pain   Patient Profile:   Maria Zuniga is a 53 y.o. female with Acute chest pain and ST elevation  History of Present Illness:   Maria Zuniga has a history of HTN, HLD, tobacco use (quit one year ago) and family history of CAD. She has been having off and on left shoulder pain for the past 2 weeks. Yesterday she experienced left precordial chest pain and left shoulder pain that lasted for 45 minutes but did  go away. 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 No N/V, dyspnea, or diaphoresis. Went to Urgent care. Ecg showed ST elevation in leads V2-3 with septal Q waves and lateral T wave inversion. Sent to ED. Pain resolved with sl Ntg. Ecg improved but shows evidence of recent anteroseptal infarct. Troponin > 27,000.   Heart Pathway Score:     Past Medical History:  Diagnosis Date  . Hyperlipidemia   . Hypertension   . Thyroid disease     Past Surgical History:  Procedure Laterality Date  . HIP SURGERY    . KNEE SURGERY    . TUBAL LIGATION       Medications Prior to Admission: Prior to Admission medications   Not on File    Lipitor 40 mg daily. Fenofibrate 54 mg daily Zetia 10 mg daily Synthroid 100 mcg daily.  Allergies:   No Known Allergies  Social History:   Social History   Socioeconomic History  . Marital status: Unknown    Spouse name: Not on file  . Number of children: Not on file  . Years of education: Not on file  . Highest education level: Not on file  Occupational History  . Occupation: mail carrier  Social Needs  . Financial resource strain: Not on file  . Food insecurity    Worry: Not on file     Inability: Not on file  . Transportation needs    Medical: Not on file    Non-medical: Not on file  Tobacco Use  . Smoking status: Former Smoker    Types: Cigarettes    Start date: 05/10/2018  . Smokeless tobacco: Never Used  Substance and Sexual Activity  . Alcohol use: Not Currently  . Drug use: Never  . Sexual activity: Not on file  Lifestyle  . Physical activity    Days per week: Not on file    Minutes per session: Not on file  . Stress: Not on file  Relationships  . Social Herbalist on phone: Not on file    Gets together: Not on file    Attends religious service: Not on file    Active member of club or organization: Not on file    Attends meetings of clubs or organizations: Not on file    Relationship status: Not on file  . Intimate partner violence    Fear of current or ex partner: Not on file    Emotionally abused: Not on file    Physically abused: Not on file    Forced sexual activity: Not on file  Other Topics Concern  . Not on file  Social History Narrative  . Not on file  Family History:   The patient's family history includes CAD in her mother; Heart attack in her brother; Heart disease in her maternal grandfather, maternal grandmother, and paternal grandmother.    ROS:  Please see the history of present illness.  All other ROS reviewed and negative.     Physical Exam/Data:   Vitals:   05/11/19 1254 05/11/19 1330 05/11/19 1331 05/11/19 1345  BP: 139/76 136/78  (!) 154/78  Pulse: 85 82  79  Resp: 16 14  13   Temp: 99.1 F (37.3 C)     TempSrc: Oral     SpO2: 99% 100%  100%  Weight: 63.5 kg  63.5 kg   Height: 5\' 3"  (1.6 m)  5\' 3"  (1.6 m)    No intake or output data in the 24 hours ending 05/11/19 1442 Last 3 Weights 05/11/2019 05/11/2019  Weight (lbs) 140 lb 140 lb  Weight (kg) 63.504 kg 63.504 kg     Body mass index is 24.8 kg/m.  General:  Well nourished, small frame, in no acute distress HEENT: normal Lymph: no adenopathy  Neck: no bruits or JVD Endocrine:  No thryomegaly Vascular: No carotid bruits; FA pulses 2+ bilaterally without bruits  Cardiac:  normal S1, S2; RRR; no murmur  Lungs:  clear to auscultation bilaterally, no wheezing, rhonchi or rales  Abd: soft, nontender, no hepatomegaly  Ext: no edema. Pulses 1-2+ equal Musculoskeletal:  No deformities, BUE and BLE strength normal and equal Skin: warm and dry  Neuro:  CNs 2-12 intact, no focal abnormalities noted Psych:  Normal affect    EKG:  The ECG that was done today was personally reviewed and demonstrates NSR with anteroseptal infarct with Q waves in V1-3. T wave inversion anterolaterally. I have personally reviewed and interpreted this study.   Relevant CV Studies: none  Laboratory Data:  High Sensitivity Troponin:   Recent Labs  Lab 05/11/19 1323  TROPONINIHS >27,000*      Chemistry Recent Labs  Lab 05/11/19 1323  NA 139  K 4.1  CL 106  CO2 23  GLUCOSE 122*  BUN 13  CREATININE 0.68  CALCIUM 9.6  GFRNONAA >60  GFRAA >60  ANIONGAP 10    No results for input(s): PROT, ALBUMIN, AST, ALT, ALKPHOS, BILITOT in the last 168 hours. Hematology Recent Labs  Lab 05/11/19 1323 05/11/19 1337  WBC 9.9 11.0*  RBC 4.38 4.49  HGB 13.6 14.3  HCT 42.0 42.5  MCV 95.9 94.7  MCH 31.1 31.8  MCHC 32.4 33.6  RDW 14.0 13.8  PLT 352 340   BNPNo results for input(s): BNP, PROBNP in the last 168 hours.  DDimer No results for input(s): DDIMER in the last 168 hours.   Radiology/Studies:  No results found.  Assessment and Plan:   1. Subacute anteroseptal STEMI. Patient has had stuttering chest pain for the last 2 weeks worsening in last 2 days. Today with pain she had dynamic ST elevation in leads V2-3. Troponin is high. Plan urgent cardiac cath to define anatomy and treatment options. ASA given. High dose statin. IV Ntg and heparin. 2. HTN controlled. 3. Hyperlipidemia. Good control on medical therapy 4. History of tobacco abuse.  Reports quitting one year ago 5. Hypothyroidism   Severity of Illness: The appropriate patient status for this patient is INPATIENT. Inpatient status is judged to be reasonable and necessary in order to provide the required intensity of service to ensure the patient's safety. The patient's presenting symptoms, physical exam findings, and initial radiographic and  laboratory data in the context of their chronic comorbidities is felt to place them at high risk for further clinical deterioration. Furthermore, it is not anticipated that the patient will be medically stable for discharge from the hospital within 2 midnights of admission. The following factors support the patient status of inpatient.   " The patient's presenting symptoms include chest pain. " The worrisome physical exam findings include none. " The initial radiographic and laboratory data are worrisome because of ST elevation on ECG, elevated troponin. " The chronic co-morbidities include tobacco use, HTN, HLD.   * I certify that at the point of admission it is my clinical judgment that the patient will require inpatient hospital care spanning beyond 2 midnights from the point of admission due to high intensity of service, high risk for further deterioration and high frequency of surveillance required.*    For questions or updates, please contact [facility] Please consult [email or web address] for contact info under        Signed, Antawn Sison [place], MD  05/11/2019 2:42 PM

## 2019-05-11 NOTE — Progress Notes (Signed)
ANTICOAGULATION CONSULT NOTE - Initial Consult  Pharmacy Consult for heparin Indication: chest pain/ACS  No Known Allergies  Patient Measurements: Height: 5\' 3"  (160 cm) Weight: 140 lb (63.5 kg) IBW/kg (Calculated) : 52.4   Vital Signs: Temp: 99.1 F (37.3 C) (10/03 1254) Temp Source: Oral (10/03 1254) BP: 148/91 (10/03 1521) Pulse Rate: 95 (10/03 1521)  Labs: Recent Labs    05/11/19 1323 05/11/19 1337  HGB 13.6 14.3  HCT 42.0 42.5  PLT 352 340  APTT  --  32  LABPROT  --  13.6  INR  --  1.1  CREATININE 0.68 0.75  TROPONINIHS >27,000* >27,000*    Estimated Creatinine Clearance: 71.2 mL/min (by C-G formula based on SCr of 0.75 mg/dL).   Medical History: Past Medical History:  Diagnosis Date  . Hyperlipidemia   . Hypertension   . Thyroid disease     Medications:  Medications Prior to Admission  Medication Sig Dispense Refill Last Dose  . atorvastatin (LIPITOR) 40 MG tablet Take 40 mg by mouth daily.   05/10/2019 at Unknown time  . ezetimibe (ZETIA) 10 MG tablet Take 10 mg by mouth daily.   05/10/2019 at Unknown time  . fenofibrate 54 MG tablet Take 54 mg by mouth daily.   05/10/2019 at Unknown time  . levothyroxine (SYNTHROID) 100 MCG tablet Take 100 mcg by mouth daily before breakfast.   05/11/2019 at Unknown time  . furosemide (LASIX) 20 MG tablet Take 20 mg by mouth daily as needed for fluid or edema.   Not Taking at Unknown time    Assessment: 53 yo female with STEMI post cath with severe left main and critical ostial LAD stenosis. Patient for CABG consulted and pharmacy to dose heparin (begin 8 hours post sheath removal). Not on anticoagulants PTA.  -Hg= 14.3, plt= 340  Goal of Therapy:  Heparin level 0.3-0.7 units/ml Monitor platelets by anticoagulation protocol: Yes   Plan:  -start heparin at 850 units/hr at 11:30pm -Heparin level in 8 hours and daily wth CBC daily  Hildred Laser, PharmD Clinical Pharmacist **Pharmacist phone directory can now be  found on [email or web address] ([email or web address]).  Listed under [place].

## 2019-05-12 ENCOUNTER — Inpatient Hospital Stay (HOSPITAL_COMMUNITY): Payer: 59

## 2019-05-12 DIAGNOSIS — Z01810 Encounter for preprocedural cardiovascular examination: Secondary | ICD-10-CM

## 2019-05-12 DIAGNOSIS — I2510 Atherosclerotic heart disease of native coronary artery without angina pectoris: Secondary | ICD-10-CM

## 2019-05-12 LAB — BASIC METABOLIC PANEL
Anion gap: 12 (ref 5–15)
BUN: 8 mg/dL (ref 6–20)
CO2: 20 mmol/L — ABNORMAL LOW (ref 22–32)
Calcium: 9.3 mg/dL (ref 8.9–10.3)
Chloride: 108 mmol/L (ref 98–111)
Creatinine, Ser: 0.76 mg/dL (ref 0.44–1.00)
GFR calc Af Amer: >60 mL/min (ref >60)
GFR calc non Af Amer: >60 mL/min (ref >60)
Glucose, Bld: 151 mg/dL — ABNORMAL HIGH (ref 70–99)
Potassium: 4.3 mmol/L (ref 3.5–5.1)
Sodium: 140 mmol/L (ref 135–145)

## 2019-05-12 LAB — CBC
HCT: 40.5 % (ref 36.0–46.0)
Hemoglobin: 13.5 g/dL (ref 12.0–15.0)
MCH: 31.3 pg (ref 26.0–34.0)
MCHC: 33.3 g/dL (ref 30.0–36.0)
MCV: 93.8 fL (ref 80.0–100.0)
Platelets: 313 10*3/uL (ref 150–400)
RBC: 4.32 MIL/uL (ref 3.87–5.11)
RDW: 13.8 % (ref 11.5–15.5)
WBC: 9.6 10*3/uL (ref 4.0–10.5)
nRBC: 0 % (ref 0.0–0.2)

## 2019-05-12 LAB — ECHOCARDIOGRAM COMPLETE
Height: 63 in
Weight: 2211.65 oz

## 2019-05-12 LAB — SURGICAL PCR SCREEN
MRSA, PCR: NEGATIVE
Staphylococcus aureus: NEGATIVE

## 2019-05-12 LAB — HEPARIN LEVEL (UNFRACTIONATED)
Heparin Unfractionated: <0.1 IU/mL — ABNORMAL LOW (ref 0.30–0.70)
Heparin Unfractionated: 0.18 IU/mL — ABNORMAL LOW (ref 0.30–0.70)

## 2019-05-12 MED ORDER — PERFLUTREN LIPID MICROSPHERE
1.0000 mL | INTRAVENOUS | Status: AC | PRN
  Administered 2019-05-12: 12:00:00 2 mL via INTRAVENOUS
  Filled 2019-05-12: qty 10

## 2019-05-12 NOTE — Progress Notes (Addendum)
Progress Note  Patient Name: Maria Zuniga Date of Encounter: 05/12/2019  Primary Cardiologist: No primary care provider on file.   Subjective   Feeling well.  No recurrent chest pain since last night.  No SOB.   Inpatient Medications    Scheduled Meds: . aspirin  81 mg Oral Daily  . atorvastatin  80 mg Oral q1800  . carvedilol  6.25 mg Oral BID WC  . Chlorhexidine Gluconate Cloth  6 each Topical Daily  . ezetimibe  10 mg Oral Daily  . fenofibrate  54 mg Oral Daily  . levothyroxine  100 mcg Oral QAC breakfast  . sodium chloride flush  3 mL Intravenous Q12H   Continuous Infusions: . sodium chloride Stopped (05/11/19 1604)  . sodium chloride    . heparin 850 Units/hr (05/12/19 0700)  . nitroGLYCERIN 20 mcg/min (05/12/19 0700)   PRN Meds: sodium chloride, acetaminophen, nitroGLYCERIN, ondansetron (ZOFRAN) IV, sodium chloride flush   Vital Signs    Vitals:   05/12/19 0600 05/12/19 0700 05/12/19 0800 05/12/19 0816  BP: 125/73 124/73 119/72   Pulse: 81 86 86   Resp: 18 14 16    Temp:    99 F (37.2 C)  TempSrc:    Oral  SpO2: 94% 94% 95%   Weight:      Height:        Intake/Output Summary (Last 24 hours) at 05/12/2019 0845 Last data filed at 05/12/2019 0800 Gross per 24 hour  Intake 1216.22 ml  Output 3300 ml  Net -2083.78 ml   Last 3 Weights 05/12/2019 05/11/2019 05/11/2019  Weight (lbs) 138 lb 3.7 oz 139 lb 15.9 oz 140 lb  Weight (kg) 62.7 kg 63.5 kg 63.504 kg      Telemetry    Sinus rhythm.  3 beats NSVT - Personally Reviewed  ECG    05/12/19: Sinus rhythm. Rate 85 bpm.  Anteroseptal MI-evolving.  Lateral TWI - Personally Reviewed  Physical Exam   VS:  BP 109/65   Pulse 82   Temp 99 F (37.2 C) (Oral)   Resp 20   Ht 5\' 3"  (1.6 m)   Wt 62.7 kg   SpO2 96%   BMI 24.49 kg/m  , BMI Body mass index is 24.49 kg/m. GENERAL:  Well appearing HEENT: Pupils equal round and reactive, fundi not visualized.  Proptosis noted. oral mucosa unremarkable NECK:   No jugular venous distention, waveform within normal limits, carotid upstroke brisk and symmetric, no bruits LUNGS:  Clear to auscultation bilaterally HEART:  RRR.  PMI not displaced or sustained,S1 and S2 within normal limits, no S3, no S4, no clicks, no rubs, no murmurs ABD:  Flat, positive bowel sounds normal in frequency in pitch, no bruits, no rebound, no guarding, no midline pulsatile mass, no hepatomegaly, no splenomegaly EXT:  2 plus pulses throughout, no edema, no cyanosis no clubbing SKIN:  No rashes no nodules NEURO:  Cranial nerves II through XII grossly intact, motor grossly intact throughout [facility]:  Cognitively intact, oriented to person place and time   Labs    High Sensitivity Troponin:   Recent Labs  Lab 05/11/19 1323 05/11/19 1337  TROPONINIHS >27,000* >27,000*      Chemistry Recent Labs  Lab 05/11/19 1323 05/11/19 1337  NA 139 140  K 4.1 4.0  CL 106 107  CO2 23 22  GLUCOSE 122* 108*  BUN 13 14  CREATININE 0.68 0.75  CALCIUM 9.6 9.8  PROT  --  7.2  ALBUMIN  --  4.3  AST  --  158*  ALT  --  45*  ALKPHOS  --  68  BILITOT  --  0.3  GFRNONAA >60 >60  GFRAA >60 >60  ANIONGAP 10 11     Hematology Recent Labs  Lab 05/11/19 1323 05/11/19 1337  WBC 9.9 11.0*  RBC 4.38 4.49  HGB 13.6 14.3  HCT 42.0 42.5  MCV 95.9 94.7  MCH 31.1 31.8  MCHC 32.4 33.6  RDW 14.0 13.8  PLT 352 340    BNP Recent Labs  Lab 05/11/19 1633  BNP 265.6*     DDimer No results for input(s): DDIMER in the last 168 hours.   Radiology    No results found.  Cardiac Studies   LHC 05/12/19:  Ost LM to Dist LM lesion is 75% stenosed.  Ost LAD lesion is 95% stenosed.  Ost Cx lesion is 60% stenosed.  Prox RCA lesion is 35% stenosed.  There is moderate left ventricular systolic dysfunction.  LV end diastolic pressure is moderately elevated.  The left ventricular ejection fraction is 35-45% by visual estimate.   1. Severe left main and critical ostial LAD  stenosis. Severe dampening of pressures with any catheter engagement.  2. Moderately elevated LVDEP.  3. Moderate LV dysfunction. EF estimated at 35-40% with anteroapical wall motion abnormality.    Patient Profile     53 y.o. female with hypertension, hyperlipidemia, prior tobacco abuse and family history of CAD here with subacute STEMI.    Assessment & Plan    # STEMI: # Hyperlipidemia: Presented with symptoms over two weeks and dynamic ST elevations anteriorly.  Cath showed 75% LM, 95% ostial LAD, 60% LC and 35% RCA disease.  Awaiting CABG.  Date pending.  Echo pending.  Continue aspirin, heparin, nitro drip, carvedilol, atorvastatin, Zetia, fenofibrate.  LDL 54.   # Hypertension: Blood pressure well-controlled.  Continue carvedilol  # Tobacco abuse:  Quit one year ago.  Time spent: 35 minutes-Greater than 50% of this time was spent in counseling, explanation of diagnosis, planning of further management, and coordination of care.   For questions or updates, please contact [facility] Please consult [email or web address] for contact info under        Signed, Chilton Si, MD  05/12/2019, 8:45 AM

## 2019-05-12 NOTE — Progress Notes (Signed)
ANTICOAGULATION CONSULT NOTE - Follow Up Consult  Pharmacy Consult for heparin Indication: chest pain/ACS  No Known Allergies  Patient Measurements: Height: 5\' 3"  (160 cm) Weight: 138 lb 3.7 oz (62.7 kg) IBW/kg (Calculated) : 52.4   Vital Signs: Temp: 99 F (37.2 C) (10/04 1608) Temp Source: Oral (10/04 1608) BP: 129/66 (10/04 1800) Pulse Rate: 84 (10/04 1800)  Labs: Recent Labs    05/11/19 1323 05/11/19 1337 05/12/19 0940 05/12/19 1902  HGB 13.6 14.3 13.5  --   HCT 42.0 42.5 40.5  --   PLT 352 340 313  --   APTT  --  32  --   --   LABPROT  --  13.6  --   --   INR  --  1.1  --   --   HEPARINUNFRC  --   --  <0.10* 0.18*  CREATININE 0.68 0.75 0.76  --   TROPONINIHS >27,000* >27,000*  --   --     Estimated Creatinine Clearance: 65.7 mL/min (by C-G formula based on SCr of 0.76 mg/dL).   Medical History: Past Medical History:  Diagnosis Date  . Hyperlipidemia   . Hypertension   . Thyroid disease     Medications:  Medications Prior to Admission  Medication Sig Dispense Refill Last Dose  . atorvastatin (LIPITOR) 40 MG tablet Take 40 mg by mouth daily.   05/10/2019 at Unknown time  . ezetimibe (ZETIA) 10 MG tablet Take 10 mg by mouth daily.   05/10/2019 at Unknown time  . fenofibrate 54 MG tablet Take 54 mg by mouth daily.   05/10/2019 at Unknown time  . levothyroxine (SYNTHROID) 100 MCG tablet Take 100 mcg by mouth daily before breakfast.   05/11/2019 at Unknown time  . furosemide (LASIX) 20 MG tablet Take 20 mg by mouth daily as needed for fluid or edema.   Not Taking at Unknown time    Assessment: 53 yo female with STEMI post cath with severe left main and critical ostial LAD stenosis. Patient for CABG consulted and pharmacy to dose heparin (begin 8 hours post sheath removal). Not on anticoagulants PTA.   Heparin level from undetectable to 0.18 (subtherapeutic) s/p rate increase to 1000 units/hr, no line issues or bleeding per RN report.  Goal of Therapy:   Heparin level 0.3-0.7 units/ml Monitor platelets by anticoagulation protocol: Yes   Plan:  Increase heparin gtt to 1150 units/hr F/u with AM labs  Bertis Ruddy, PharmD Clinical Pharmacist Please check AMION for all [place] numbers 05/12/2019 7:55 PM

## 2019-05-12 NOTE — Progress Notes (Signed)
Pre-CABG Dopplers completed. Refer to "CV Proc" under chart review to view preliminary results.  05/12/2019 2:22 PM Maudry Mayhew, MHA, RVT, RDCS, RDMS

## 2019-05-12 NOTE — Plan of Care (Signed)

## 2019-05-12 NOTE — Progress Notes (Signed)
Patient ID: Maria Zuniga, female   DOB: 01/28/1966, 53 y.o.   MRN: [id number] T[facility] DAILY ICU PROGRESS NOTE                   [address]            [place] [address]          [phone]   1 Day Post-Op Procedure(s) (LRB): LEFT HEART CATH AND CORONARY ANGIOGRAPHY (N/A)  Total Length of Stay:  LOS: 1 day   Subjective: Patient awake alert sitting in chair denies chest pain, no shortness of breath  Objective: Vital signs in last 24 hours: Temp:  [98.5 F (36.9 C)-99.5 F (37.5 C)] 99 F (37.2 C) (10/04 0816) Pulse Rate:  [79-104] 82 (10/04 0900) Cardiac Rhythm: Normal sinus rhythm (10/04 0800) Resp:  [8-28] 20 (10/04 0900) BP: (99-164)/(53-91) 109/65 (10/04 0900) SpO2:  [90 %-100 %] 96 % (10/04 0900) FiO2 (%):  [28 %] 28 % (10/03 1556) Weight:  [62.7 kg-63.5 kg] 62.7 kg (10/04 0500)  Filed Weights   05/11/19 1331 05/11/19 1600 05/12/19 0500  Weight: 63.5 kg 63.5 kg 62.7 kg    Weight change:    Hemodynamic parameters for last 24 hours:    Intake/Output from previous day: 10/03 0701 - 10/04 0700 In: 1201.7 [P.O.:786; I.V.:415.7] Out: 2500 [Urine:2500]  Intake/Output this shift: Total I/O In: 149 [P.O.:120; I.V.:29] Out: 800 [Urine:800]  Current Meds: Scheduled Meds: . aspirin  81 mg Oral Daily  . atorvastatin  80 mg Oral q1800  . carvedilol  6.25 mg Oral BID WC  . Chlorhexidine Gluconate Cloth  6 each Topical Daily  . ezetimibe  10 mg Oral Daily  . fenofibrate  54 mg Oral Daily  . levothyroxine  100 mcg Oral QAC breakfast  . sodium chloride flush  3 mL Intravenous Q12H   Continuous Infusions: . sodium chloride Stopped (05/11/19 1604)  . sodium chloride    . heparin 850 Units/hr (05/12/19 0700)  . nitroGLYCERIN 20 mcg/min (05/12/19 0700)   PRN Meds:.sodium chloride, acetaminophen, nitroGLYCERIN, ondansetron (ZOFRAN) IV, sodium chloride flush  General appearance: alert, cooperative and no distress Neurologic: intact Heart: regular  rate and rhythm, S1, S2 normal, no murmur, click, rub or gallop Lungs: clear to auscultation bilaterally Abdomen: soft, non-tender; bowel sounds normal; no masses,  no organomegaly Extremities: extremities normal, atraumatic, no cyanosis or edema and Homans sign is negative, no sign of DVT Wound: Right radial cath site intact  Lab Results: CBC: Recent Labs    05/11/19 1323 05/11/19 1337  WBC 9.9 11.0*  HGB 13.6 14.3  HCT 42.0 42.5  PLT 352 340   BMET:  Recent Labs    05/11/19 1323 05/11/19 1337  NA 139 140  K 4.1 4.0  CL 106 107  CO2 23 22  GLUCOSE 122* 108*  BUN 13 14  CREATININE 0.68 0.75  CALCIUM 9.6 9.8    CMET: Lab Results  Component Value Date   WBC 11.0 (H) 05/11/2019   HGB 14.3 05/11/2019   HCT 42.5 05/11/2019   PLT 340 05/11/2019   GLUCOSE 108 (H) 05/11/2019   CHOL 93 05/11/2019   TRIG 70 05/11/2019   HDL 25 (L) 05/11/2019   LDLCALC 54 05/11/2019   ALT 45 (H) 05/11/2019   AST 158 (H) 05/11/2019   NA 140 05/11/2019   K 4.0 05/11/2019   CL 107 05/11/2019   CREATININE 0.75 05/11/2019   BUN 14 05/11/2019   CO2 22 05/11/2019  TSH 0.941 05/11/2019   INR 1.1 05/11/2019   HGBA1C 6.2 (H) 05/11/2019      PT/INR:  Recent Labs    05/11/19 1337  LABPROT 13.6  INR 1.1   Radiology: Dg Chest Port 1 View  Result Date: 05/12/2019 CLINICAL DATA:  Chest pain.  Cardiopulmonary status. EXAM: PORTABLE CHEST 1 VIEW COMPARISON:  None. FINDINGS: Lungs are adequately inflated without focal airspace consolidation or effusion. Cardiomediastinal silhouette is normal. Bones and soft tissues are unremarkable. IMPRESSION: No active disease. Electronically Signed   By: Marin Olp M.D.   On: 05/12/2019 09:04     Assessment/Plan: S/P Procedure(s) (LRB): LEFT HEART CATH AND CORONARY ANGIOGRAPHY (N/A) Await carotid Doppler studies and echocardiogram Plan coronary artery bypass grafting this admission     Maria Zuniga 05/12/2019 9:38 AM

## 2019-05-12 NOTE — Plan of Care (Signed)

## 2019-05-12 NOTE — Progress Notes (Signed)
  Echocardiogram 2D Echocardiogram has been performed.  Jennette Dubin 05/12/2019, 11:26 AM

## 2019-05-12 NOTE — Progress Notes (Signed)
ANTICOAGULATION CONSULT NOTE - Follow Up Consult  Pharmacy Consult for heparin Indication: chest pain/ACS  No Known Allergies  Patient Measurements: Height: 5\' 3"  (160 cm) Weight: 138 lb 3.7 oz (62.7 kg) IBW/kg (Calculated) : 52.4   Vital Signs: Temp: 99.3 F (37.4 C) (10/04 1109) Temp Source: Oral (10/04 1109) BP: 96/59 (10/04 1200) Pulse Rate: 79 (10/04 1200)  Labs: Recent Labs    05/11/19 1323 05/11/19 1337 05/12/19 0940  HGB 13.6 14.3 13.5  HCT 42.0 42.5 40.5  PLT 352 340 313  APTT  --  32  --   LABPROT  --  13.6  --   INR  --  1.1  --   HEPARINUNFRC  --   --  <0.10*  CREATININE 0.68 0.75 0.76  TROPONINIHS >27,000* >27,000*  --     Estimated Creatinine Clearance: 65.7 mL/min (by C-G formula based on SCr of 0.76 mg/dL).   Medical History: Past Medical History:  Diagnosis Date  . Hyperlipidemia   . Hypertension   . Thyroid disease     Medications:  Medications Prior to Admission  Medication Sig Dispense Refill Last Dose  . atorvastatin (LIPITOR) 40 MG tablet Take 40 mg by mouth daily.   05/10/2019 at Unknown time  . ezetimibe (ZETIA) 10 MG tablet Take 10 mg by mouth daily.   05/10/2019 at Unknown time  . fenofibrate 54 MG tablet Take 54 mg by mouth daily.   05/10/2019 at Unknown time  . levothyroxine (SYNTHROID) 100 MCG tablet Take 100 mcg by mouth daily before breakfast.   05/11/2019 at Unknown time  . furosemide (LASIX) 20 MG tablet Take 20 mg by mouth daily as needed for fluid or edema.   Not Taking at Unknown time    Assessment: 53 yo female with STEMI post cath with severe left main and critical ostial LAD stenosis. Patient for CABG consulted and pharmacy to dose heparin (begin 8 hours post sheath removal). Not on anticoagulants PTA.  Heparin drip 850 uts/hr HL < 0.1  No bleeding CBC ok   Goal of Therapy:  Heparin level 0.3-0.7 units/ml Monitor platelets by anticoagulation protocol: Yes   Plan:  Increase heparin 1000 units/hr -Heparin level in 8  hours and daily wth CBC daily  Bonnita Nasuti Pharm.D. CPP, BCPS Clinical Pharmacist [phone] 05/12/2019 12:34 PM

## 2019-05-13 ENCOUNTER — Encounter (HOSPITAL_COMMUNITY): Payer: Self-pay | Admitting: Cardiology

## 2019-05-13 ENCOUNTER — Other Ambulatory Visit: Payer: Self-pay | Admitting: *Deleted

## 2019-05-13 DIAGNOSIS — I2510 Atherosclerotic heart disease of native coronary artery without angina pectoris: Secondary | ICD-10-CM

## 2019-05-13 LAB — CBC
HCT: 38.5 % (ref 36.0–46.0)
Hemoglobin: 13.3 g/dL (ref 12.0–15.0)
MCH: 32.3 pg (ref 26.0–34.0)
MCHC: 34.5 g/dL (ref 30.0–36.0)
MCV: 93.4 fL (ref 80.0–100.0)
Platelets: 296 10*3/uL (ref 150–400)
RBC: 4.12 MIL/uL (ref 3.87–5.11)
RDW: 13.7 % (ref 11.5–15.5)
WBC: 10.3 10*3/uL (ref 4.0–10.5)
nRBC: 0 % (ref 0.0–0.2)

## 2019-05-13 LAB — POCT I-STAT, CHEM 8
BUN: 13 mg/dL (ref 6–20)
Calcium, Ion: 1.29 mmol/L (ref 1.15–1.40)
Chloride: 109 mmol/L (ref 98–111)
Creatinine, Ser: 0.6 mg/dL (ref 0.44–1.00)
Glucose, Bld: 100 mg/dL — ABNORMAL HIGH (ref 70–99)
HCT: 41 % (ref 36.0–46.0)
Hemoglobin: 13.9 g/dL (ref 12.0–15.0)
Potassium: 4.1 mmol/L (ref 3.5–5.1)
Sodium: 143 mmol/L (ref 135–145)
TCO2: 21 mmol/L — ABNORMAL LOW (ref 22–32)

## 2019-05-13 LAB — HEPARIN LEVEL (UNFRACTIONATED)
Heparin Unfractionated: 0.4 IU/mL (ref 0.30–0.70)
Heparin Unfractionated: 0.31 IU/mL (ref 0.30–0.70)

## 2019-05-13 MED FILL — Nitroglycerin IV Soln 100 MCG/ML in D5W: INTRA_ARTERIAL | Qty: 20 | Status: AC

## 2019-05-13 NOTE — Progress Notes (Signed)
Discussed sternal precautions, IS (1750 mL), mobility post op, and d/c planning with pt. Good reception. Gave materials to review. Did not ambulate due to LM 95.  [id number] Yves Dill CES, ACSM 11:05 AM 05/13/2019

## 2019-05-13 NOTE — Progress Notes (Signed)
Pt. Transferred to [id number]

## 2019-05-13 NOTE — Progress Notes (Signed)
ANTICOAGULATION CONSULT NOTE - Follow Up Consult  Pharmacy Consult for heparin Indication: chest pain/ACS  No Known Allergies  Patient Measurements: Height: 5\' 3"  (160 cm) Weight: 138 lb 10.7 oz (62.9 kg) IBW/kg (Calculated) : 52.4   Vital Signs: Temp: 98.5 F (36.9 C) (10/05 0827) Temp Source: Oral (10/05 0827) BP: 104/57 (10/05 0800) Pulse Rate: 77 (10/05 0800)  Labs: Recent Labs    05/11/19 1323 05/11/19 1337 05/11/19 1455 05/12/19 0940 05/12/19 1902 05/13/19 0245  HGB 13.6 14.3 13.9 13.5  --  13.3  HCT 42.0 42.5 41.0 40.5  --  38.5  PLT 352 340  --  313  --  296  APTT  --  32  --   --   --   --   LABPROT  --  13.6  --   --   --   --   INR  --  1.1  --   --   --   --   HEPARINUNFRC  --   --   --  <0.10* 0.18* 0.40  CREATININE 0.68 0.75 0.60 0.76  --   --   TROPONINIHS >27,000* >27,000*  --   --   --   --     Estimated Creatinine Clearance: 71 mL/min (by C-G formula based on SCr of 0.76 mg/dL).   Medical History: Past Medical History:  Diagnosis Date  . Hyperlipidemia   . Hypertension   . Thyroid disease     Medications:  Medications Prior to Admission  Medication Sig Dispense Refill Last Dose  . atorvastatin (LIPITOR) 40 MG tablet Take 40 mg by mouth daily.   05/10/2019 at Unknown time  . ezetimibe (ZETIA) 10 MG tablet Take 10 mg by mouth daily.   05/10/2019 at Unknown time  . fenofibrate 54 MG tablet Take 54 mg by mouth daily.   05/10/2019 at Unknown time  . levothyroxine (SYNTHROID) 100 MCG tablet Take 100 mcg by mouth daily before breakfast.   05/11/2019 at Unknown time  . furosemide (LASIX) 20 MG tablet Take 20 mg by mouth daily as needed for fluid or edema.   Not Taking at Unknown time    Assessment: 53 yo female with STEMI post cath with severe left main and critical ostial LAD stenosis. Patient for CABG consulted and pharmacy to dose heparin (begin 8 hours post sheath removal). Not on anticoagulants PTA.   LV thrombus on Echo. Plan is to continue  IV heparin for 1-2 days prior to CABG, per Cardiology note today (10/5).  Heparin level increased from 0.18 to 0.4 (within goal range) s/p rate increase to 1150 units/hr. Re-checked level to confirm - resulted as 0.31 this afternoon. CBC ok, no mention of bleeding or line issues.    Goal of Therapy:  Heparin level 0.3-0.7 units/ml Monitor platelets by anticoagulation protocol: Yes   Plan:  Continue heparin rate 1150 units/hr  Daily heparin level and CBC Will follow CABG timing   Vallery Sa, PharmD Candidate    Please check AMION for all [place] numbers 05/13/2019 10:52 AM

## 2019-05-13 NOTE — Progress Notes (Addendum)
Progress Note  Patient Name: Maria Zuniga Date of Encounter: 05/13/2019  Primary Cardiologist: No primary care provider on file.   Subjective   He is doing well this morning and is comfortably lying in bed.  He denies chest pain or shortness of breath.  Inpatient Medications    Scheduled Meds: . aspirin  81 mg Oral Daily  . atorvastatin  80 mg Oral q1800  . carvedilol  6.25 mg Oral BID WC  . Chlorhexidine Gluconate Cloth  6 each Topical Daily  . ezetimibe  10 mg Oral Daily  . fenofibrate  54 mg Oral Daily  . levothyroxine  100 mcg Oral QAC breakfast  . sodium chloride flush  3 mL Intravenous Q12H   Continuous Infusions: . sodium chloride Stopped (05/11/19 1604)  . sodium chloride 10 mL/hr at 05/13/19 0700  . heparin 1,150 Units/hr (05/13/19 0700)  . nitroGLYCERIN Stopped (05/12/19 1200)   PRN Meds: sodium chloride, acetaminophen, nitroGLYCERIN, ondansetron (ZOFRAN) IV, sodium chloride flush   Vital Signs    Vitals:   05/13/19 0402 05/13/19 0500 05/13/19 0600 05/13/19 0700  BP:  (!) 91/59 100/61   Pulse:  76 74 77  Resp:      Temp: 98.4 F (36.9 C)     TempSrc: Oral     SpO2:  94% 93% 96%  Weight:  62.9 kg    Height:   (1.6 m)      Intake/Output Summary (Last 24 hours) at 05/13/2019 0725 Last data filed at 05/13/2019 0700 Gross per 24 hour  Intake 1739.09 ml  Output 3900 ml  Net -2160.91 ml   Filed Weights   05/11/19 1600 05/12/19 0500 05/13/19 0500  Weight: 63.5 kg 62.7 kg 62.9 kg    Telemetry    May 13, 2019.  Noticeable PVCs- Personally Reviewed  ECG    May 13, 2019.  Normal sinus, ST and T wave abnormalities in the anterior lateral leads- Personally Reviewed  Physical Exam   GEN: No acute distress.   Cardiac: RRR, no murmurs, rubs, or gallops.  Respiratory: Clear to auscultation bilaterally. GI: Soft, nontender, non-distended  MS: No edema; No deformity. Psych: Normal affect   Labs    Chemistry Recent Labs  Lab 05/11/19  1323 05/11/19 1337 05/12/19 0940  NA 139 140 140  K 4.1 4.0 4.3  CL 106 107 108  CO2 23 22 20*  GLUCOSE 122* 108* 151*  BUN CREATININE 0.68 0.75 0.76  CALCIUM 9.6 9.8 9.3  PROT  --  7.2  --   ALBUMIN  --  4.3  --   AST  --  158*  --   ALT  --  45*  --   ALKPHOS  --  68  --   BILITOT  --  0.3  --   GFRNONAA >60 >60 >60  GFRAA >60 >60 >60  ANIONGAP Hematology Recent Labs  Lab 05/11/19 1337 05/12/19 0940 05/13/19 0245  WBC 11.0* 9.6 10.3  RBC 4.49 4.32 4.12  HGB 14.3 13.5 13.3  HCT 42.5 40.5 38.5  MCV 94.7 93.8 93.4  MCH 31.8 31.3 32.3  MCHC 33.6 33.3 34.5  RDW 13.8 13.8 13.7  PLT 340 313 296    Cardiac EnzymesNo results for input(s): TROPONINI in the last 168 hours. No results for input(s): TROPIPOC in the last 168 hours.   BNP Recent Labs  Lab 05/11/19 1633  BNP 265.6*     DDimer  No results for input(s): DDIMER in the last 168 hours.   Radiology    Dg Chest Port 1 View  Result Date: 05/12/2019 CLINICAL DATA:  Chest pain.  Cardiopulmonary status. EXAM: PORTABLE CHEST 1 VIEW COMPARISON:  None. FINDINGS: Lungs are adequately inflated without focal airspace consolidation or effusion. Cardiomediastinal silhouette is normal. Bones and soft tissues are unremarkable. IMPRESSION: No active disease. Electronically Signed   By: Elberta Fortis M.D.   On: 05/12/2019 09:04   Vas US Doppler Pre Cabg  Result Date: 05/12/2019 PREOPERATIVE VASCULAR EVALUATION  Indications:      Pre-surgical evaluation. Risk Factors:     Hypertension, hyperlipidemia, past history of smoking. Comparison Study: No prior study Performing Technologist: Gertie Fey MHA, RDMS, RVT, RDCS  Examination Guidelines: A complete evaluation includes B-mode imaging, spectral Doppler, color Doppler, and power Doppler as needed of all accessible portions of each vessel. Bilateral testing is considered an integral part of a complete examination. Limited examinations for reoccurring  indications may be performed as noted.  Right Carotid Findings: +----------+--------+--------+--------+---------------------+------------------+           PSV cm/sEDV cm/sStenosisDescribe             Comments           +----------+--------+--------+--------+---------------------+------------------+ CCA Prox  94      17                                   intimal thickening +----------+--------+--------+--------+---------------------+------------------+ CCA Mid   78      19              Moderate smooth                                                           homogenous plaque                       +----------+--------+--------+--------+---------------------+------------------+ CCA Distal72      19                                                      +----------+--------+--------+--------+---------------------+------------------+ ICA Prox  91      29              smooth and                                                                heterogenous                            +----------+--------+--------+--------+---------------------+------------------+ ICA Distal52      24                                                      +----------+--------+--------+--------+---------------------+------------------+  ECA       195     21              smooth and                                                                heterogenous                            +----------+--------+--------+--------+---------------------+------------------+ Portions of this table do not appear on this page. +----------+--------+-------+----------------+------------+           PSV cm/sEDV cmsDescribe        Arm Pressure +----------+--------+-------+----------------+------------+ Subclavian209            Multiphasic, [id number]          +----------+--------+-------+----------------+------------+ +---------+--------+--+--------+--+---------+ VertebralPSV cm/s76EDV  cm/s22Antegrade +---------+--------+--+--------+--+---------+ Left Carotid Findings: +----------+--------+--------+--------+-------------------------------+--------+           PSV cm/sEDV cm/sStenosisDescribe                       Comments +----------+--------+--------+--------+-------------------------------+--------+ CCA Prox  69      13              smooth and heterogenous                 +----------+--------+--------+--------+-------------------------------+--------+ CCA Mid   141     42              Moderate to severe smooth                                                 homogenous plaque                       +----------+--------+--------+--------+-------------------------------+--------+ CCA Distal112     24                                                      +----------+--------+--------+--------+-------------------------------+--------+ ICA Prox  175     65              smooth and heterogenous                 +----------+--------+--------+--------+-------------------------------+--------+ ICA Distal97      22                                                      +----------+--------+--------+--------+-------------------------------+--------+ ECA       168     14              irregular, calcific and  heterogenous                            +----------+--------+--------+--------+-------------------------------+--------+ +----------+--------+--------+----------------+------------+ SubclavianPSV cm/sEDV cm/sDescribe        Arm Pressure +----------+--------+--------+----------------+------------+           127             Multiphasic, WNL96           +----------+--------+--------+----------------+------------+ +---------+--------+--+--------+--+---------+ VertebralPSV cm/s51EDV cm/s17Antegrade +---------+--------+--+--------+--+---------+  ABI Findings:  +--------+------------------+-----+---------+--------+ Right   Rt Pressure (mmHg)IndexWaveform Comment  +--------+------------------+-----+---------+--------+ [id number]                    triphasic         +--------+------------------+-----+---------+--------+ PTA     95                0.89 biphasic          +--------+------------------+-----+---------+--------+ DP      95                0.89 biphasic          +--------+------------------+-----+---------+--------+ +--------+------------------+-----+----------+-------+ Left    Lt Pressure (mmHg)IndexWaveform  Comment +--------+------------------+-----+----------+-------+ Brachial96                     triphasic         +--------+------------------+-----+----------+-------+ PTA     71                0.66 monophasic        +--------+------------------+-----+----------+-------+ DP      78                0.73 monophasic        +--------+------------------+-----+----------+-------+ +-------+---------------+----------------+ ABI/TBIToday's ABI/TBIPrevious ABI/TBI +-------+---------------+----------------+ Right  0.89                            +-------+---------------+----------------+ Left   0.73                            +-------+---------------+----------------+  Right Doppler Findings: +-----------+--------+-----+---------+-----------------------------------------+ Site       PressureIndexDoppler  Comments                                  +-----------+--------+-----+---------+-----------------------------------------+ Brachial   107          triphasic                                          +-----------+--------+-----+---------+-----------------------------------------+ Radial                  triphasic                                          +-----------+--------+-----+---------+-----------------------------------------+ Ulnar                   triphasic                                           +-----------+--------+-----+---------+-----------------------------------------+ Palmar Arch  Signal decreases <50% wirh radial                                          compression, decreases 50% with ulnar                                      compression.                              +-----------+--------+-----+---------+-----------------------------------------+  Left Doppler Findings: +-----------+--------+-----+---------+---------------------+ Site       PressureIndexDoppler  Comments              +-----------+--------+-----+---------+---------------------+ Brachial   96           triphasic                      +-----------+--------+-----+---------+---------------------+ Radial                  triphasic                      +-----------+--------+-----+---------+---------------------+ Ulnar                   triphasic                      +-----------+--------+-----+---------+---------------------+ Palmar Arch                      Within normal limits. +-----------+--------+-----+---------+---------------------+  Summary: Right Carotid: Velocities in the right ICA are consistent with a 1-39% stenosis. Left Carotid: Velocities in the left ICA are consistent with an upper range               40-59% stenosis. Vertebrals:  Bilateral vertebral arteries demonstrate antegrade flow. Subclavians: Normal flow hemodynamics were seen in bilateral subclavian              arteries. Right ABI: Resting right ankle-brachial index indicates mild right lower extremity arterial disease. Left ABI: Resting left ankle-brachial index indicates moderate left lower extremity arterial disease.  Electronically signed by Sherald Hesshristopher Clark MD on 05/12/2019 at 2:41:43 PM.    Final     Cardiac Studies   LHC 05/12/19:  Ost LM to Dist LM lesion is 75% stenosed.  Ost LAD lesion is 95% stenosed.  Ost Cx lesion is 60% stenosed.  Prox RCA lesion is 35%  stenosed.  There is moderate left ventricular systolic dysfunction.  LV end diastolic pressure is moderately elevated.  The left ventricular ejection fraction is 35-45% by visual estimate.  1. Severe left main and critical ostial LAD stenosis. Severe dampening of pressures with any catheter engagement.  2. Moderately elevated LVDEP.  3. Moderate LV dysfunction. EF estimated at 35-40% with anteroapical wall motion abnormality.   Echocardiogram on May 12, 2019 IMPRESSIONS   1. Left ventricular ejection fraction, by visual estimation, is 35%. The left ventricle has moderately decreased function. Normal left ventricular size. There is mildly increased left ventricular hypertrophy.  2. Multiple segmental abnormalities exist. See findings.  3. Definity contrast agent was given IV to delineate the left ventricular endocardial borders. There is loosely organized thrombus at the apex.  4. Global right ventricle has normal systolic function.The right ventricular size is  normal. No increase in right ventricular wall thickness.  5. Left atrial size was normal.  6. Right atrial size was normal.  7. The mitral valve is grossly normal. Trace mitral valve regurgitation.  8. The tricuspid valve is grossly normal. Tricuspid valve regurgitation is trivial.  9. The pulmonic valve was grossly normal. Pulmonic valve regurgitation is not visualized by color flow Doppler. 10. The inferior vena cava is normal in size with greater than 50% respiratory variability, suggesting right atrial pressure of 3 mmHg. 11. The aortic valve was not well visualized Aortic valve regurgitation was not visualized by color flow Doppler. 12. TR signal is inadequate for assessing pulmonary artery systolic pressure.  Patient Profile     53 y.o. female with hypertension, hyperlipidemia, prior tobacco abuse and family history of CAD here with subacute STEMI.    Assessment & Plan    #Subacute STEMI Today, he denies chest pain  or shortness of breath.  Left heart cath showed multivessel disease.  His echocardiogram shows an ejection fraction of 35%.  However, there is concern for an apical thrombus in the left ventricle.  Due to this reason, cardiothoracic surgery plans to continue heparin for another day before surgical procedure. -Continue heparin -Continue aspirin, Lipitor, Coreg, Zetia, fenofibrate, sublingual nitroglycerin -Appreciate cardiothoracic surgery recommendation  #Hyperlipidemia -Continue Lipitor, Zetia, fenofibrate  #Hypertension: BP unremarkable   For questions or updates, please contact [facility] Please consult [email or web address] for contact info under Cardiology/STEMI.      Signed, Yvette Rack, MD  05/13/2019, 7:25 AM

## 2019-05-13 NOTE — Progress Notes (Signed)
Patient ID: Maria Zuniga, female   DOB: 1966-07-10, 53 y.o.   MRN: [id number] [facility] DAILY ICU PROGRESS NOTE                   [address]            [place] [address]          [phone]   2 Days Post-Op Procedure(s) (LRB): LEFT HEART CATH AND CORONARY ANGIOGRAPHY (N/A)  Total Length of Stay:  LOS: 2 days   Subjective: Patient feels well denies any shortness of breath or angina, remains on heparin drip  Objective: Vital signs in last 24 hours: Temp:  [97.9 F (36.6 C)-99.3 F (37.4 C)] 99.3 F (37.4 C) (10/05 1619) Pulse Rate:  [74-84] 77 (10/05 0800) Cardiac Rhythm: Normal sinus rhythm (10/05 1100) Resp:  [15-23] 15 (10/05 0000) BP: (90-129)/(57-66) 98/66 (10/05 1300) SpO2:  [93 %-98 %] 98 % (10/05 0800) Weight:  [62.9 kg] 62.9 kg (10/05 0500)  Filed Weights   05/11/19 1600 05/12/19 0500 05/13/19 0500  Weight: 63.5 kg 62.7 kg 62.9 kg    Weight change: -0.604 kg   Hemodynamic parameters for last 24 hours:    Intake/Output from previous day: 10/04 0701 - 10/05 0700 In: 1739.1 [P.O.:1340; I.V.:399.1] Out: 3900 [Urine:3900]  Intake/Output this shift: Total I/O In: 632.3 [P.O.:480; I.V.:152.3] Out: 500 [Urine:500]  Current Meds: Scheduled Meds: . aspirin  81 mg Oral Daily  . atorvastatin  80 mg Oral q1800  . carvedilol  6.25 mg Oral BID WC  . Chlorhexidine Gluconate Cloth  6 each Topical Daily  . ezetimibe  10 mg Oral Daily  . fenofibrate  54 mg Oral Daily  . levothyroxine  100 mcg Oral QAC breakfast  . sodium chloride flush  3 mL Intravenous Q12H   Continuous Infusions: . sodium chloride Stopped (05/11/19 1604)  . sodium chloride 10 mL/hr at 05/13/19 1200  . heparin 1,250 Units/hr (05/13/19 1512)  . nitroGLYCERIN Stopped (05/12/19 1200)   PRN Meds:.sodium chloride, acetaminophen, nitroGLYCERIN, ondansetron (ZOFRAN) IV, sodium chloride flush  General appearance: alert and cooperative Neurologic: intact Heart: regular rate and  rhythm, S1, S2 normal, no murmur, click, rub or gallop Lungs: clear to auscultation bilaterally Abdomen: soft, non-tender; bowel sounds normal; no masses,  no organomegaly Extremities: extremities normal, atraumatic, no cyanosis or edema and Homans sign is negative, no sign of DVT  Lab Results: CBC: Recent Labs    05/12/19 0940 05/13/19 0245  WBC 9.6 10.3  HGB 13.5 13.3  HCT 40.5 38.5  PLT 313 296   BMET:  Recent Labs    05/11/19 1337 05/11/19 1455 05/12/19 0940  NA 140 143 140  K 4.0 4.1 4.3  CL 107 109 108  CO2 22  --  20*  GLUCOSE 108* 100* 151*  BUN 14 13 8   CREATININE 0.75 0.60 0.76  CALCIUM 9.8  --  9.3    CMET: Lab Results  Component Value Date   WBC 10.3 05/13/2019   HGB 13.3 05/13/2019   HCT 38.5 05/13/2019   PLT 296 05/13/2019   GLUCOSE 151 (H) 05/12/2019   CHOL 93 05/11/2019   TRIG 70 05/11/2019   HDL 25 (L) 05/11/2019   LDLCALC 54 05/11/2019   ALT 45 (H) 05/11/2019   AST 158 (H) 05/11/2019   NA 140 05/12/2019   K 4.3 05/12/2019   CL 108 05/12/2019   CREATININE 0.76 05/12/2019   BUN 8 05/12/2019   CO2 20 (L) 05/12/2019  TSH 0.941 05/11/2019   INR 1.1 05/11/2019   HGBA1C 6.2 (H) 05/11/2019      PT/INR:  Recent Labs    05/11/19 1337  LABPROT 13.6  INR 1.1   Radiology: No results found.   Assessment/Plan: S/P Procedure(s) (LRB): LEFT HEART CATH AND CORONARY ANGIOGRAPHY (N/A) Mobilize Plan to continue IV heparin until the time of coronary artery bypass grafting Echo in case reviewed with Dr. [place], on echo there is some subtle evidence of anterior apical clot, will plan to proceed with coronary artery bypass grafting on Friday October 9 We will consider repeat echo on October 8 I discussed this with the patient and she is had her questions answered   [name] 05/13/2019 4:57 PM

## 2019-05-14 ENCOUNTER — Inpatient Hospital Stay (HOSPITAL_COMMUNITY): Payer: 59

## 2019-05-14 LAB — CBC
HCT: 37.4 % (ref 36.0–46.0)
Hemoglobin: 13 g/dL (ref 12.0–15.0)
MCH: 32.3 pg (ref 26.0–34.0)
MCHC: 34.8 g/dL (ref 30.0–36.0)
MCV: 92.8 fL (ref 80.0–100.0)
Platelets: 283 10*3/uL (ref 150–400)
RBC: 4.03 MIL/uL (ref 3.87–5.11)
RDW: 13.6 % (ref 11.5–15.5)
WBC: 10.5 10*3/uL (ref 4.0–10.5)
nRBC: 0 % (ref 0.0–0.2)

## 2019-05-14 LAB — PULMONARY FUNCTION TEST
FEF 25-75 Pre: 2.2 L/sec
FEF2575-%Pred-Pre: 87 %
FEV1-%Pred-Pre: 87 %
FEV1-Pre: 2.26 L
FEV1FVC-%Pred-Pre: 101 %
FEV6-%Pred-Pre: 87 %
FEV6-Pre: 2.82 L
FEV6FVC-%Pred-Pre: 103 %
FVC-%Pred-Pre: 85 %
FVC-Pre: 2.82 L
Pre FEV1/FVC ratio: 80 %
Pre FEV6/FVC Ratio: 100 %

## 2019-05-14 LAB — HEPARIN LEVEL (UNFRACTIONATED): Heparin Unfractionated: 0.52 IU/mL (ref 0.30–0.70)

## 2019-05-14 MED ORDER — LOSARTAN POTASSIUM 25 MG PO TABS
25.0000 mg | ORAL_TABLET | Freq: Every day | ORAL | Status: DC
  Administered 2019-05-14 – 2019-05-16 (×3): 25 mg via ORAL
  Filled 2019-05-14 (×3): qty 1

## 2019-05-14 NOTE — Progress Notes (Signed)
ANTICOAGULATION CONSULT NOTE - Follow Up Consult  Pharmacy Consult for heparin Indication: chest pain/ACS  No Known Allergies  Patient Measurements: Height: 5\' 3"  (160 cm) Weight: 139 lb (63 kg) IBW/kg (Calculated) : 52.4   Vital Signs: Temp: 98.2 F (36.8 C) (10/06 0349) Temp Source: Oral (10/06 0349) BP: 122/70 (10/06 0349) Pulse Rate: 75 (10/06 0349)  Labs: Recent Labs    05/11/19 1323 05/11/19 1337 05/11/19 1455 05/12/19 0940  05/13/19 0245 05/13/19 1303 05/14/19 0212  HGB 13.6 14.3 13.9 13.5  --  13.3  --  13.0  HCT 42.0 42.5 41.0 40.5  --  38.5  --  37.4  PLT 352 340  --  313  --  296  --  283  APTT  --  32  --   --   --   --   --   --   LABPROT  --  13.6  --   --   --   --   --   --   INR  --  1.1  --   --   --   --   --   --   HEPARINUNFRC  --   --   --  <0.10*   < > 0.40 0.31 0.52  CREATININE 0.68 0.75 0.60 0.76  --   --   --   --   TROPONINIHS >27,000* >27,000*  --   --   --   --   --   --    < > = values in this interval not displayed.    Estimated Creatinine Clearance: 71.1 mL/min (by C-G formula based on SCr of 0.76 mg/dL).   Medical History: Past Medical History:  Diagnosis Date  . Hyperlipidemia   . Hypertension   . Thyroid disease     Medications:  Medications Prior to Admission  Medication Sig Dispense Refill Last Dose  . atorvastatin (LIPITOR) 40 MG tablet Take 40 mg by mouth daily.   05/10/2019 at Unknown time  . ezetimibe (ZETIA) 10 MG tablet Take 10 mg by mouth daily.   05/10/2019 at Unknown time  . fenofibrate 54 MG tablet Take 54 mg by mouth daily.   05/10/2019 at Unknown time  . levothyroxine (SYNTHROID) 100 MCG tablet Take 100 mcg by mouth daily before breakfast.   05/11/2019 at Unknown time  . furosemide (LASIX) 20 MG tablet Take 20 mg by mouth daily as needed for fluid or edema.   Not Taking at Unknown time    Assessment: 53 yo female with STEMI post cath with severe left main and critical ostial LAD stenosis. Patient for CABG  consulted and pharmacy to dose heparin (begin 8 hours post sheath removal). Not on anticoagulants PTA.   LV thrombus on Echo. Plan is to continue IV heparin for 1-2 days prior to CABG, per Cardiology note 10/5.  Heparin level remains therapeutic this AM at 0.52 on 1250 units/hr. CBC ok, no mention of bleeding or line issues.   Goal of Therapy:  Heparin level 0.3-0.7 units/ml Monitor platelets by anticoagulation protocol: Yes   Plan:  Continue heparin rate 1250 units/hr  Daily heparin level and CBC Monitor for s/sx of bleeding Will follow CABG timing   Kennon Holter, PharmD PGY1 [facility] Pharmacy Resident [employer] Phone: [phone]

## 2019-05-14 NOTE — Progress Notes (Signed)
Progress Note  Patient Name: Maria Zuniga Date of Encounter: 05/14/2019  Primary Cardiologist: No primary care provider on file.   Subjective   Patient is feeling well this morning. Denies recurrent chest pain or shortness of breath. Plan for surgery on Friday.   Inpatient Medications    Scheduled Meds: . aspirin  81 mg Oral Daily  . atorvastatin  80 mg Oral q1800  . carvedilol  6.25 mg Oral BID WC  . Chlorhexidine Gluconate Cloth  6 each Topical Daily  . ezetimibe  10 mg Oral Daily  . fenofibrate  54 mg Oral Daily  . levothyroxine  100 mcg Oral QAC breakfast  . sodium chloride flush  3 mL Intravenous Q12H   Continuous Infusions: . sodium chloride Stopped (05/11/19 1604)  . sodium chloride 10 mL/hr at 05/14/19 0400  . heparin 1,250 Units/hr (05/14/19 0400)  . nitroGLYCERIN Stopped (05/12/19 1200)   PRN Meds: sodium chloride, acetaminophen, nitroGLYCERIN, ondansetron (ZOFRAN) IV, sodium chloride flush   Vital Signs    Vitals:   05/13/19 2033 05/14/19 0019 05/14/19 0349 05/14/19 0352  BP: 119/69 121/62 122/70   Pulse: 78 81 75   Resp: Temp: 98.3 F (36.8 C) 97.7 F (36.5 C) 98.2 F (36.8 C)   TempSrc: Oral Oral Oral   SpO2: 96% 95% 94%   Weight:    63 kg  Height:        Intake/Output Summary (Last 24 hours) at 05/14/2019 0809 Last data filed at 05/14/2019 0400 Gross per 24 hour  Intake 1400.77 ml  Output 1000 ml  Net 400.77 ml   Last 3 Weights 05/14/2019 05/13/2019 05/13/2019  Weight (lbs) 139 lb (No Data) 138 lb 10.7 oz  Weight (kg) 63.05 kg (No Data) 62.9 kg      Telemetry    NSR, HR 70-80s; no other arrhythmias noted - Personally Reviewed  ECG    No new - Personally Reviewed  Physical Exam   GEN: No acute distress.   Neck: No JVD Cardiac: RRR, no murmurs, rubs, or gallops.  Respiratory: Clear to auscultation bilaterally. GI: Soft, nontender, non-distended  MS: No edema; No deformity. Neuro:  Nonfocal  Psych: Normal affect    Labs    High Sensitivity Troponin:   Recent Labs  Lab 05/11/19 1323 05/11/19 1337  TROPONINIHS >27,000* >27,000*      Chemistry Recent Labs  Lab 05/11/19 1323 05/11/19 1337 05/11/19 1455 05/12/19 0940  NA 139 140 143 140  K 4.1 4.0 4.1 4.3  CL 106 107 109 108  CO2 23 22  --  20*  GLUCOSE 122* 108* 100* 151*  BUN CREATININE 0.68 0.75 0.60 0.76  CALCIUM 9.6 9.8  --  9.3  PROT  --  7.2  --   --   ALBUMIN  --  4.3  --   --   AST  --  158*  --   --   ALT  --  45*  --   --   ALKPHOS  --  68  --   --   BILITOT  --  0.3  --   --   GFRNONAA >60 >60  --  >60  GFRAA >60 >60  --  >60  ANIONGAP 10 11  --  12     Hematology Recent Labs  Lab 05/12/19 0940 05/13/19 0245 05/14/19 0212  WBC 9.6 10.3 10.5  RBC 4.32 4.12 4.03  HGB 13.5 13.3 13.0  HCT 40.5 38.5 37.4  MCV 93.8 93.4 92.8  MCH 31.3 32.3 32.3  MCHC 33.3 34.5 34.8  RDW 13.8 13.7 13.6  PLT 313 296 283    BNP Recent Labs  Lab 05/11/19 1633  BNP 265.6*     DDimer No results for input(s): DDIMER in the last 168 hours.   Radiology    Vas US Doppler Pre Cabg  Result Date: 05/12/2019 PREOPERATIVE VASCULAR EVALUATION  Indications:      Pre-surgical evaluation. Risk Factors:     Hypertension, hyperlipidemia, past history of smoking. Comparison Study: No prior study Performing Technologist: Gertie Fey MHA, RDMS, RVT, RDCS  Examination Guidelines: A complete evaluation includes B-mode imaging, spectral Doppler, color Doppler, and power Doppler as needed of all accessible portions of each vessel. Bilateral testing is considered an integral part of a complete examination. Limited examinations for reoccurring indications may be performed as noted.  Right Carotid Findings: +----------+--------+--------+--------+---------------------+------------------+           PSV cm/sEDV cm/sStenosisDescribe             Comments            +----------+--------+--------+--------+---------------------+------------------+ CCA Prox  94      17                                   intimal thickening +----------+--------+--------+--------+---------------------+------------------+ CCA Mid   78      19              Moderate smooth                                                           homogenous plaque                       +----------+--------+--------+--------+---------------------+------------------+ CCA Distal72      19                                                      +----------+--------+--------+--------+---------------------+------------------+ ICA Prox  91      29              smooth and                                                                heterogenous                            +----------+--------+--------+--------+---------------------+------------------+ ICA Distal52      24                                                      +----------+--------+--------+--------+---------------------+------------------+ ECA  195     21              smooth and                                                                heterogenous                            +----------+--------+--------+--------+---------------------+------------------+ Portions of this table do not appear on this page. +----------+--------+-------+----------------+------------+           PSV cm/sEDV cmsDescribe        Arm Pressure +----------+--------+-------+----------------+------------+ Subclavian209            Multiphasic, [id number]          +----------+--------+-------+----------------+------------+ +---------+--------+--+--------+--+---------+ VertebralPSV cm/s76EDV cm/s22Antegrade +---------+--------+--+--------+--+---------+ Left Carotid Findings: +----------+--------+--------+--------+-------------------------------+--------+           PSV cm/sEDV cm/sStenosisDescribe                        Comments +----------+--------+--------+--------+-------------------------------+--------+ CCA Prox  69      13              smooth and heterogenous                 +----------+--------+--------+--------+-------------------------------+--------+ CCA Mid   141     42              Moderate to severe smooth                                                 homogenous plaque                       +----------+--------+--------+--------+-------------------------------+--------+ CCA Distal112     24                                                      +----------+--------+--------+--------+-------------------------------+--------+ ICA Prox  175     65              smooth and heterogenous                 +----------+--------+--------+--------+-------------------------------+--------+ ICA Distal97      22                                                      +----------+--------+--------+--------+-------------------------------+--------+ ECA       168     14              irregular, calcific and  heterogenous                            +----------+--------+--------+--------+-------------------------------+--------+ +----------+--------+--------+----------------+------------+ SubclavianPSV cm/sEDV cm/sDescribe        Arm Pressure +----------+--------+--------+----------------+------------+           127             Multiphasic, WNL96           +----------+--------+--------+----------------+------------+ +---------+--------+--+--------+--+---------+ VertebralPSV cm/s51EDV cm/s17Antegrade +---------+--------+--+--------+--+---------+  ABI Findings: +--------+------------------+-----+---------+--------+ Right   Rt Pressure (mmHg)IndexWaveform Comment  +--------+------------------+-----+---------+--------+ [id number]                    triphasic          +--------+------------------+-----+---------+--------+ PTA     95                0.89 biphasic          +--------+------------------+-----+---------+--------+ DP      95                0.89 biphasic          +--------+------------------+-----+---------+--------+ +--------+------------------+-----+----------+-------+ Left    Lt Pressure (mmHg)IndexWaveform  Comment +--------+------------------+-----+----------+-------+ Brachial96                     triphasic         +--------+------------------+-----+----------+-------+ PTA     71                0.66 monophasic        +--------+------------------+-----+----------+-------+ DP      78                0.73 monophasic        +--------+------------------+-----+----------+-------+ +-------+---------------+----------------+ ABI/TBIToday's ABI/TBIPrevious ABI/TBI +-------+---------------+----------------+ Right  0.89                            +-------+---------------+----------------+ Left   0.73                            +-------+---------------+----------------+  Right Doppler Findings: +-----------+--------+-----+---------+-----------------------------------------+ Site       PressureIndexDoppler  Comments                                  +-----------+--------+-----+---------+-----------------------------------------+ Brachial   107          triphasic                                          +-----------+--------+-----+---------+-----------------------------------------+ Radial                  triphasic                                          +-----------+--------+-----+---------+-----------------------------------------+ Ulnar                   triphasic                                          +-----------+--------+-----+---------+-----------------------------------------+ Palmar Arch  Signal decreases <50% wirh radial                                           compression, decreases 50% with ulnar                                      compression.                              +-----------+--------+-----+---------+-----------------------------------------+  Left Doppler Findings: +-----------+--------+-----+---------+---------------------+ Site       PressureIndexDoppler  Comments              +-----------+--------+-----+---------+---------------------+ Brachial   96           triphasic                      +-----------+--------+-----+---------+---------------------+ Radial                  triphasic                      +-----------+--------+-----+---------+---------------------+ Ulnar                   triphasic                      +-----------+--------+-----+---------+---------------------+ Palmar Arch                      Within normal limits. +-----------+--------+-----+---------+---------------------+  Summary: Right Carotid: Velocities in the right ICA are consistent with a 1-39% stenosis. Left Carotid: Velocities in the left ICA are consistent with an upper range               40-59% stenosis. Vertebrals:  Bilateral vertebral arteries demonstrate antegrade flow. Subclavians: Normal flow hemodynamics were seen in bilateral subclavian              arteries. Right ABI: Resting right ankle-brachial index indicates mild right lower extremity arterial disease. Left ABI: Resting left ankle-brachial index indicates moderate left lower extremity arterial disease.  Electronically signed by Sherald Hess MD on 05/12/2019 at 2:41:43 PM.    Final     Cardiac Studies   LHC 05/12/19:  Ost LM to Dist LM lesion is 75% stenosed.  Ost LAD lesion is 95% stenosed.  Ost Cx lesion is 60% stenosed.  Prox RCA lesion is 35% stenosed.  There is moderate left ventricular systolic dysfunction.  LV end diastolic pressure is moderately elevated.  The left ventricular ejection fraction is 35-45% by visual estimate.  1. Severe left main  and critical ostial LAD stenosis. Severe dampening of pressures with any catheter engagement.  2. Moderately elevated LVDEP.  3. Moderate LV dysfunction. EF estimated at 35-40% with anteroapical wall motion abnormality.   Echocardiogram on May 12, 2019 IMPRESSIONS  1. Left ventricular ejection fraction, by visual estimation, is 35%. The left ventricle has moderately decreased function. Normal left ventricular size. There is mildly increased left ventricular hypertrophy. 2. Multiple segmental abnormalities exist. See findings. 3. Definity contrast agent was given IV to delineate the left ventricular endocardial borders. There is loosely organized thrombus at the apex. 4. Global right ventricle has normal systolic function.The right ventricular size is normal. No increase  in right ventricular wall thickness. 5. Left atrial size was normal. 6. Right atrial size was normal. 7. The mitral valve is grossly normal. Trace mitral valve regurgitation. 8. The tricuspid valve is grossly normal. Tricuspid valve regurgitation is trivial. 9. The pulmonic valve was grossly normal. Pulmonic valve regurgitation is not visualized by color flow Doppler. 10. The inferior vena cava is normal in size with greater than 50% respiratory variability, suggesting right atrial pressure of 3 mmHg. 11. The aortic valve was not well visualized Aortic valve regurgitation was not visualized by color flow Doppler. 12. TR signal is inadequate for assessing pulmonary artery systolic pressure.  Patient Profile     53 y.o. female with HTN, Hyperlipidemia, prior tobacco use and family history of CAD here with subacute STEMI  Assessment & Plan    Subacute STEMI - Presented with over 2 weeks of anginal symptoms 05/11/19. EKG showed dynamic ST elevations - Cath showed 75% ostial, 60% LC, and 35% RCA - Patient was seen by CTTS and is awaiting CABG. - Echo showed EF 35% and concern for apical thrombus in the left  ventricle. CTTS plan for surgery on Friday with repeat echo a day before.  - Denies recurrent chest pain - Continue ASA,heparin, nitro drip, carvedilol, statin, zetia fenofibrate  Hyperlipidemia - LDL 54 - Continue atorvastatin, Zetia, fenofibrate  Hypertension - BP stable - Continue carvedilol  Tobacco abuse - Quit one year ago  For questions or updates, please contact [facility] Please consult [email or web address] for contact info under        Signed, Delta Deshmukh David Stall, PA-C  05/14/2019, 8:09 AM

## 2019-05-15 LAB — CBC
HCT: 39.6 % (ref 36.0–46.0)
Hemoglobin: 13.6 g/dL (ref 12.0–15.0)
MCH: 32.2 pg (ref 26.0–34.0)
MCHC: 34.3 g/dL (ref 30.0–36.0)
MCV: 93.6 fL (ref 80.0–100.0)
Platelets: 323 10*3/uL (ref 150–400)
RBC: 4.23 MIL/uL (ref 3.87–5.11)
RDW: 13.6 % (ref 11.5–15.5)
WBC: 10.2 10*3/uL (ref 4.0–10.5)
nRBC: 0 % (ref 0.0–0.2)

## 2019-05-15 LAB — BASIC METABOLIC PANEL
Anion gap: 8 (ref 5–15)
BUN: 16 mg/dL (ref 6–20)
CO2: 22 mmol/L (ref 22–32)
Calcium: 9.5 mg/dL (ref 8.9–10.3)
Chloride: 106 mmol/L (ref 98–111)
Creatinine, Ser: 1.04 mg/dL — ABNORMAL HIGH (ref 0.44–1.00)
GFR calc Af Amer: >60 mL/min (ref >60)
GFR calc non Af Amer: >60 mL/min (ref >60)
Glucose, Bld: 115 mg/dL — ABNORMAL HIGH (ref 70–99)
Potassium: 4.5 mmol/L (ref 3.5–5.1)
Sodium: 136 mmol/L (ref 135–145)

## 2019-05-15 LAB — URINALYSIS, COMPLETE (UACMP) WITH MICROSCOPIC
Bilirubin Urine: NEGATIVE
Glucose, UA: NEGATIVE mg/dL
Hgb urine dipstick: NEGATIVE
Ketones, ur: NEGATIVE mg/dL
Leukocytes,Ua: NEGATIVE
Nitrite: NEGATIVE
Protein, ur: NEGATIVE mg/dL
Specific Gravity, Urine: 1.008 (ref 1.005–1.030)
pH: 6 (ref 5.0–8.0)

## 2019-05-15 LAB — HEPARIN LEVEL (UNFRACTIONATED): Heparin Unfractionated: 0.55 IU/mL (ref 0.30–0.70)

## 2019-05-15 NOTE — Progress Notes (Addendum)
Progress Note  Patient Name: Maria Zuniga Date of Encounter: 05/15/2019  Primary Cardiologist: No primary care provider on file.   Subjective   Patient feels well. Denies chest pain or shortness of breath. Plan for surgery Friday.  Inpatient Medications    Scheduled Meds: . aspirin  81 mg Oral Daily  . atorvastatin  80 mg Oral q1800  . carvedilol  6.25 mg Oral BID WC  . Chlorhexidine Gluconate Cloth  6 each Topical Daily  . ezetimibe  10 mg Oral Daily  . fenofibrate  54 mg Oral Daily  . levothyroxine  100 mcg Oral QAC breakfast  . losartan  25 mg Oral Daily  . sodium chloride flush  3 mL Intravenous Q12H   Continuous Infusions: . sodium chloride Stopped (05/11/19 1604)  . sodium chloride 10 mL/hr at 05/14/19 0400  . heparin 1,250 Units/hr (05/14/19 1735)   PRN Meds: sodium chloride, acetaminophen, nitroGLYCERIN, ondansetron (ZOFRAN) IV, sodium chloride flush   Vital Signs    Vitals:   05/14/19 1711 05/14/19 2038 05/14/19 2324 05/15/19 0449  BP: (!) 111/59 118/65 107/62 (!) 107/54  Pulse: 78 73 78 69  Resp: 18 18 19 19   Temp: 98.8 F (37.1 C) 98.9 F (37.2 C) 99.4 F (37.4 C) 98.1 F (36.7 C)  TempSrc: Oral Oral Oral Oral  SpO2: 96% 97% 97% 95%  Weight:    62.1 kg  Height:       No intake or output data in the 24 hours ending 05/15/19 0755 Last 3 Weights 05/15/2019 05/14/2019 05/13/2019  Weight (lbs) 137 lb 139 lb (No Data)  Weight (kg) 62.143 kg 63.05 kg (No Data)      Telemetry    NSR, HR 70s, no other arrhythmias noted - Personally Reviewed  ECG    No new - Personally Reviewed  Physical Exam   GEN: No acute distress.   Neck: No JVD Cardiac: RRR, no murmurs, rubs, or gallops.  Respiratory: Clear to auscultation bilaterally. GI: Soft, nontender, non-distended  MS: No edema; No deformity. Neuro:  Nonfocal  Psych: Normal affect   Labs    High Sensitivity Troponin:   Recent Labs  Lab 05/11/19 1323 05/11/19 1337  TROPONINIHS >27,000*  >27,000*      Chemistry Recent Labs  Lab 05/11/19 1337 05/11/19 1455 05/12/19 0940 05/15/19 0135  NA 140 143 140 136  K 4.0 4.1 4.3 4.5  CL 107 109 108 106  CO2 22  --  20* 22  GLUCOSE 108* 100* 151* 115*  BUN 14 13 8 16   CREATININE 0.75 0.60 0.76 1.04*  CALCIUM 9.8  --  9.3 9.5  PROT 7.2  --   --   --   ALBUMIN 4.3  --   --   --   AST 158*  --   --   --   ALT 45*  --   --   --   ALKPHOS 68  --   --   --   BILITOT 0.3  --   --   --   GFRNONAA >60  --  >60 >60  GFRAA >60  --  >60 >60  ANIONGAP 11  --  12 8     Hematology Recent Labs  Lab 05/13/19 0245 05/14/19 0212 05/15/19 0135  WBC 10.3 10.5 10.2  RBC 4.12 4.03 4.23  HGB 13.3 13.0 13.6  HCT 38.5 37.4 39.6  MCV 93.4 92.8 93.6  MCH 32.3 32.3 32.2  MCHC 34.5 34.8 34.3  RDW 13.7  13.6 13.6  PLT 296 283 323    BNP Recent Labs  Lab 05/11/19 1633  BNP 265.6*     DDimer No results for input(s): DDIMER in the last 168 hours.   Radiology    No results found.  Cardiac Studies   LHC 05/12/19:  Ost LM to Dist LM lesion is 75% stenosed.  Ost LAD lesion is 95% stenosed.  Ost Cx lesion is 60% stenosed.  Prox RCA lesion is 35% stenosed.  There is moderate left ventricular systolic dysfunction.  LV end diastolic pressure is moderately elevated.  The left ventricular ejection fraction is 35-45% by visual estimate.  1. Severe left main and critical ostial LAD stenosis. Severe dampening of pressures with any catheter engagement.  2. Moderately elevated LVDEP.  3. Moderate LV dysfunction. EF estimated at 35-40% with anteroapical wall motion abnormality.  Echocardiogram on May 12, 2019 IMPRESSIONS  1. Left ventricular ejection fraction, by visual estimation, is 35%. The left ventricle has moderately decreased function. Normal left ventricular size. There is mildly increased left ventricular hypertrophy. 2. Multiple segmental abnormalities exist. See findings. 3. Definity contrast agent was given  IV to delineate the left ventricular endocardial borders. There is loosely organized thrombus at the apex. 4. Global right ventricle has normal systolic function.The right ventricular size is normal. No increase in right ventricular wall thickness. 5. Left atrial size was normal. 6. Right atrial size was normal. 7. The mitral valve is grossly normal. Trace mitral valve regurgitation. 8. The tricuspid valve is grossly normal. Tricuspid valve regurgitation is trivial. 9. The pulmonic valve was grossly normal. Pulmonic valve regurgitation is not visualized by color flow Doppler. 10. The inferior vena cava is normal in size with greater than 50% respiratory variability, suggesting right atrial pressure of 3 mmHg. 11. The aortic valve was not well visualized Aortic valve regurgitation was not visualized by color flow Doppler. 12. TR signal is inadequate for assessing pulmonary artery systolic pressure.  Patient Profile     53 y.o. female with HTN, Hyperlipidemia, prior tobacco use and family history of CAD here with subacute STEMI  Assessment & Plan    Subacute STEMI - Presented with over 2 weeks of anginal symptoms 05/11/19. EKG showed dynamic ST elevations - Cath showed 75% ostial, 60% LC, and 35% RCA - Patient was seen by CTTS and is awaiting CABG. - Echo showed EF 35% and concern for apical thrombus in the left ventricle. CTTS plan for surgery on Friday with possible repeat echo a day before.  - Denies recurrent chest pain - Continue ASA, IV heparin,carvedilol, statin, zetia fenofibrate. Losartan started yesterday  Hyperlipidemia - LDL 54 - Continue atorvastatin, Zetia, fenofibrate  Hypertension - BP so far stable - Continue carvedilol 6.25 mg daily - Losartan 25 mg added - Today BP 107/54 - Creatinine up from 10/4 0.76 > 1.04. Continue to monitor. If continues to worsen tomorrow may have to hold ARB  Tobacco abuse - Quit one year ago   For questions or updates, please  contact [facility] Please consult [email or web address] for contact info under        Signed, Broady Lafoy David Stall, PA-C  05/15/2019, 7:55 AM

## 2019-05-15 NOTE — Progress Notes (Signed)
ANTICOAGULATION CONSULT NOTE - Follow Up Consult  Pharmacy Consult for heparin Indication: chest pain/ACS  No Known Allergies  Patient Measurements: Height: 5\' 3"  (160 cm) Weight: 137 lb (62.1 kg) IBW/kg (Calculated) : 52.4   Vital Signs: Temp: 98.1 F (36.7 C) (10/07 0449) Temp Source: Oral (10/07 0449) BP: 107/54 (10/07 0449) Pulse Rate: 69 (10/07 0449)  Labs: Recent Labs    05/12/19 0940  05/13/19 0245 05/13/19 1303 05/14/19 0212 05/15/19 0135  HGB 13.5  --  13.3  --  13.0 13.6  HCT 40.5  --  38.5  --  37.4 39.6  PLT 313  --  296  --  283 323  HEPARINUNFRC <0.10*   < > 0.40 0.31 0.52 0.55  CREATININE 0.76  --   --   --   --  1.04*   < > = values in this interval not displayed.    Estimated Creatinine Clearance: 50.6 mL/min (A) (by C-G formula based on SCr of 1.04 mg/dL (H)).   Medical History: Past Medical History:  Diagnosis Date  . Hyperlipidemia   . Hypertension   . Thyroid disease     Medications:  Medications Prior to Admission  Medication Sig Dispense Refill Last Dose  . atorvastatin (LIPITOR) 40 MG tablet Take 40 mg by mouth daily.   05/10/2019 at Unknown time  . ezetimibe (ZETIA) 10 MG tablet Take 10 mg by mouth daily.   05/10/2019 at Unknown time  . fenofibrate 54 MG tablet Take 54 mg by mouth daily.   05/10/2019 at Unknown time  . levothyroxine (SYNTHROID) 100 MCG tablet Take 100 mcg by mouth daily before breakfast.   05/11/2019 at Unknown time  . furosemide (LASIX) 20 MG tablet Take 20 mg by mouth daily as needed for fluid or edema.   Not Taking at Unknown time    Assessment: 53 yo female with STEMI post cath with severe left main and critical ostial LAD stenosis. Patient for CABG consulted and pharmacy to dose heparin (begin 8 hours post sheath removal). Not on anticoagulants PTA.   LV thrombus on Echo. Plan is to continue IV heparin for 1-2 days prior to CABG, per Cardiology note 10/5.  Heparin level remains therapeutic this AM at 0.55 on 1250  units/hr. CBC ok, no mention of bleeding or line issues.   Goal of Therapy:  Heparin level 0.3-0.7 units/ml Monitor platelets by anticoagulation protocol: Yes   Plan:  Continue heparin rate at 1250 units/hr  Daily heparin level and CBC Monitor for s/sx of bleeding Will follow CABG timing   Kennon Holter, PharmD PGY1 Ambulatory Care Pharmacy Resident [employer] Phone: [phone]

## 2019-05-16 ENCOUNTER — Inpatient Hospital Stay (HOSPITAL_COMMUNITY): Payer: 59

## 2019-05-16 ENCOUNTER — Encounter (HOSPITAL_COMMUNITY): Payer: Self-pay | Admitting: Certified Registered Nurse Anesthetist

## 2019-05-16 DIAGNOSIS — I2510 Atherosclerotic heart disease of native coronary artery without angina pectoris: Secondary | ICD-10-CM

## 2019-05-16 DIAGNOSIS — Z01810 Encounter for preprocedural cardiovascular examination: Secondary | ICD-10-CM

## 2019-05-16 LAB — TYPE AND SCREEN
ABO/RH(D): O POS
Antibody Screen: NEGATIVE

## 2019-05-16 LAB — BLOOD GAS, ARTERIAL
Acid-base deficit: 1.6 mmol/L (ref 0.0–2.0)
Bicarbonate: 22.1 mmol/L (ref 20.0–28.0)
Drawn by: 257081
FIO2: 21
O2 Saturation: 97.3 %
Patient temperature: 98.6
pCO2 arterial: 34 mmHg (ref 32.0–48.0)
pH, Arterial: 7.43 (ref 7.350–7.450)
pO2, Arterial: 95.6 mmHg (ref 83.0–108.0)

## 2019-05-16 LAB — COMPREHENSIVE METABOLIC PANEL
ALT: 20 U/L (ref 0–44)
AST: 18 U/L (ref 15–41)
Albumin: 3.4 g/dL — ABNORMAL LOW (ref 3.5–5.0)
Alkaline Phosphatase: 54 U/L (ref 38–126)
Anion gap: 12 (ref 5–15)
BUN: 19 mg/dL (ref 6–20)
CO2: 22 mmol/L (ref 22–32)
Calcium: 9.3 mg/dL (ref 8.9–10.3)
Chloride: 104 mmol/L (ref 98–111)
Creatinine, Ser: 0.96 mg/dL (ref 0.44–1.00)
GFR calc Af Amer: >60 mL/min (ref >60)
GFR calc non Af Amer: >60 mL/min (ref >60)
Glucose, Bld: 106 mg/dL — ABNORMAL HIGH (ref 70–99)
Potassium: 4.1 mmol/L (ref 3.5–5.1)
Sodium: 138 mmol/L (ref 135–145)
Total Bilirubin: 0.7 mg/dL (ref 0.3–1.2)
Total Protein: 6.5 g/dL (ref 6.5–8.1)

## 2019-05-16 LAB — ABO/RH: ABO/RH(D): O POS

## 2019-05-16 LAB — CBC
HCT: 39 % (ref 36.0–46.0)
Hemoglobin: 13.3 g/dL (ref 12.0–15.0)
MCH: 31.8 pg (ref 26.0–34.0)
MCHC: 34.1 g/dL (ref 30.0–36.0)
MCV: 93.3 fL (ref 80.0–100.0)
Platelets: 341 10*3/uL (ref 150–400)
RBC: 4.18 MIL/uL (ref 3.87–5.11)
RDW: 13.5 % (ref 11.5–15.5)
WBC: 9.1 10*3/uL (ref 4.0–10.5)
nRBC: 0 % (ref 0.0–0.2)

## 2019-05-16 LAB — ECHOCARDIOGRAM LIMITED
Height: 63 in
Weight: 2203.2 oz

## 2019-05-16 LAB — HEPARIN LEVEL (UNFRACTIONATED): Heparin Unfractionated: 0.55 IU/mL (ref 0.30–0.70)

## 2019-05-16 MED ORDER — CHLORHEXIDINE GLUCONATE CLOTH 2 % EX PADS
6.0000 | MEDICATED_PAD | Freq: Once | CUTANEOUS | Status: AC
  Administered 2019-05-17: 05:00:00 6 via TOPICAL

## 2019-05-16 MED ORDER — SODIUM CHLORIDE 0.9 % IV SOLN
1.5000 g | INTRAVENOUS | Status: AC
  Administered 2019-05-17: 1.5 g via INTRAVENOUS
  Filled 2019-05-16: qty 1.5

## 2019-05-16 MED ORDER — TRANEXAMIC ACID 1000 MG/10ML IV SOLN
1.5000 mg/kg/h | INTRAVENOUS | Status: AC
  Administered 2019-05-17: 1.5 mg/kg/h via INTRAVENOUS
  Filled 2019-05-16: qty 25

## 2019-05-16 MED ORDER — TRANEXAMIC ACID (OHS) PUMP PRIME SOLUTION
2.0000 mg/kg | INTRAVENOUS | Status: DC
  Filled 2019-05-16: qty 1.25

## 2019-05-16 MED ORDER — DEXMEDETOMIDINE HCL IN NACL 400 MCG/100ML IV SOLN
0.1000 ug/kg/h | INTRAVENOUS | Status: AC
  Administered 2019-05-17: 09:00:00 .3 ug/kg/h via INTRAVENOUS
  Filled 2019-05-16: qty 100

## 2019-05-16 MED ORDER — INSULIN REGULAR(HUMAN) IN NACL 100-0.9 UT/100ML-% IV SOLN
INTRAVENOUS | Status: AC
  Administered 2019-05-17: 1 [IU]/h via INTRAVENOUS
  Filled 2019-05-16: qty 100

## 2019-05-16 MED ORDER — PLASMA-LYTE 148 IV SOLN
INTRAVENOUS | Status: AC
  Administered 2019-05-17: 09:00:00 500 mL
  Filled 2019-05-16: qty 2.5

## 2019-05-16 MED ORDER — SODIUM CHLORIDE 0.9 % IV SOLN
INTRAVENOUS | Status: DC
  Filled 2019-05-16: qty 30

## 2019-05-16 MED ORDER — DOPAMINE-DEXTROSE 3.2-5 MG/ML-% IV SOLN
0.0000 ug/kg/min | INTRAVENOUS | Status: DC
  Filled 2019-05-16: qty 250

## 2019-05-16 MED ORDER — SODIUM CHLORIDE 0.9 % IV SOLN
750.0000 mg | INTRAVENOUS | Status: AC
  Administered 2019-05-17: 750 mg via INTRAVENOUS
  Filled 2019-05-16: qty 750

## 2019-05-16 MED ORDER — MILRINONE LACTATE IN DEXTROSE 20-5 MG/100ML-% IV SOLN
0.3000 ug/kg/min | INTRAVENOUS | Status: DC
  Filled 2019-05-16: qty 100

## 2019-05-16 MED ORDER — TRANEXAMIC ACID (OHS) BOLUS VIA INFUSION
15.0000 mg/kg | INTRAVENOUS | Status: AC
  Administered 2019-05-17: 08:00:00 937.5 mg via INTRAVENOUS
  Filled 2019-05-16: qty 938

## 2019-05-16 MED ORDER — VANCOMYCIN HCL 10 G IV SOLR
1250.0000 mg | INTRAVENOUS | Status: AC
  Administered 2019-05-17: 1250 mg via INTRAVENOUS
  Filled 2019-05-16: qty 1250

## 2019-05-16 MED ORDER — CHLORHEXIDINE GLUCONATE CLOTH 2 % EX PADS
6.0000 | MEDICATED_PAD | Freq: Once | CUTANEOUS | Status: AC
  Administered 2019-05-17: 01:00:00 6 via TOPICAL

## 2019-05-16 MED ORDER — PERFLUTREN LIPID MICROSPHERE
1.0000 mL | INTRAVENOUS | Status: AC | PRN
  Administered 2019-05-16 (×2): 2 mL via INTRAVENOUS
  Filled 2019-05-16: qty 10

## 2019-05-16 MED ORDER — MAGNESIUM SULFATE 50 % IJ SOLN
40.0000 meq | INTRAMUSCULAR | Status: DC
  Filled 2019-05-16: qty 9.85

## 2019-05-16 MED ORDER — BISACODYL 5 MG PO TBEC
5.0000 mg | DELAYED_RELEASE_TABLET | Freq: Once | ORAL | Status: AC
  Administered 2019-05-16: 17:00:00 5 mg via ORAL
  Filled 2019-05-16: qty 1

## 2019-05-16 MED ORDER — POTASSIUM CHLORIDE 2 MEQ/ML IV SOLN
80.0000 meq | INTRAVENOUS | Status: DC
  Filled 2019-05-16: qty 40

## 2019-05-16 MED ORDER — TEMAZEPAM 15 MG PO CAPS: 15.0000 mg | ORAL_CAPSULE | Freq: Once | ORAL | Status: DC | PRN

## 2019-05-16 MED ORDER — CHLORHEXIDINE GLUCONATE 0.12 % MT SOLN
15.0000 mL | Freq: Once | OROMUCOSAL | Status: AC
  Administered 2019-05-17: 05:00:00 15 mL via OROMUCOSAL
  Filled 2019-05-16: qty 15

## 2019-05-16 MED ORDER — EPINEPHRINE HCL 5 MG/250ML IV SOLN IN NS
0.0000 ug/min | INTRAVENOUS | Status: DC
  Filled 2019-05-16: qty 250

## 2019-05-16 MED ORDER — PHENYLEPHRINE HCL-NACL 20-0.9 MG/250ML-% IV SOLN
30.0000 ug/min | INTRAVENOUS | Status: AC
  Administered 2019-05-17: 08:00:00 15 ug/min via INTRAVENOUS
  Filled 2019-05-16: qty 250

## 2019-05-16 MED ORDER — NITROGLYCERIN IN D5W 200-5 MCG/ML-% IV SOLN
2.0000 ug/min | INTRAVENOUS | Status: AC
  Administered 2019-05-17: 08:00:00 5 ug/min via INTRAVENOUS
  Filled 2019-05-16: qty 250

## 2019-05-16 NOTE — Anesthesia Preprocedure Evaluation (Addendum)
Anesthesia Evaluation  Patient identified by MRN, date of birth, ID band Patient awake    Reviewed: Allergy & Precautions, NPO status , Patient's Chart, lab work & pertinent test results  Airway Mallampati: II  TM Distance: >3 FB Neck ROM: Full    Dental  (+) Edentulous Upper, Edentulous Lower   Pulmonary former smoker,    Pulmonary exam normal breath sounds clear to auscultation       Cardiovascular hypertension, + CAD, + Past MI and +CHF  Normal cardiovascular exam Rhythm:Regular Rate:Normal  ECHO: 1. Severe LV dysfunction with EF 25-30% with swirling of definity contrast in apex. Study suggestive of possible early forming thrombus in LV apex in some views. There is no left ventricular hypertrophy. Normal left ventricular size. Severe LV dysfunction with EF 25-30% with swirling of  definity contrast in apex. Study suggestive of possible early forming thrombus in LV apex in some views.   1. Left ventricular ejection fraction, by visual estimation, is 35%. The left ventricle has moderately decreased function. Normal left ventricular size. There is mildly increased left ventricular hypertrophy.  2. Multiple segmental abnormalities exist. See findings.  3. Definity contrast agent was given IV to delineate the left ventricular endocardial borders. There is loosely organized thrombus at the apex.  4. Global right ventricle has normal systolic function.The right ventricular size is normal. No increase in right ventricular wall thickness.  5. Left atrial size was normal.  6. Right atrial size was normal.  7. The mitral valve is grossly normal. Trace mitral valve regurgitation.  8. The tricuspid valve is grossly normal. Tricuspid valve regurgitation is trivial.  9. The pulmonic valve was grossly normal. Pulmonic valve regurgitation is not visualized by color flow Doppler. 10. The inferior vena cava is normal in size with greater than 50%  respiratory variability, suggesting right atrial pressure of 3 mmHg. 11. The aortic valve was not well visualized Aortic valve regurgitation was not visualized by color flow Doppler. 12. TR signal is inadequate for assessing pulmonary artery systolic pressure.  CATH: Ost LM to Dist LM lesion is 75% stenosed. Ost LAD lesion is 95% stenosed. Ost Cx lesion is 60% stenosed. Prox RCA lesion is 35% stenosed. There is moderate left ventricular systolic dysfunction. LV end diastolic pressure is moderately elevated. The left ventricular ejection fraction is 35-45% by visual estimate.   1. Severe left main and critical ostial LAD stenosis. Severe dampening of pressures with any catheter engagement.  2. Moderately elevated LVDEP.  3. Moderate LV dysfunction. EF estimated at 35-40% with anteroapical wall motion abnormality.    Neuro/Psych negative neurological ROS  negative psych ROS   GI/Hepatic negative GI ROS, Neg liver ROS,   Endo/Other  Hypothyroidism   Renal/GU negative Renal ROS     Musculoskeletal negative musculoskeletal ROS (+)   Abdominal   Peds  Hematology HLD   Anesthesia Other Findings   Reproductive/Obstetrics                            Anesthesia Physical Anesthesia Plan  ASA: IV  Anesthesia Plan: General   Post-op Pain Management:    Induction: Intravenous  PONV Risk Score and Plan: 3 and Ondansetron, Dexamethasone, Midazolam and Treatment may vary due to age or medical condition  Airway Management Planned: Oral ETT  Additional Equipment: Arterial line, CVP, PA Cath, TEE and Ultrasound Guidance Line Placement  Intra-op Plan:   Post-operative Plan: Post-operative intubation/ventilation  Informed Consent: I have reviewed  the patients History and Physical, chart, labs and discussed the procedure including the risks, benefits and alternatives for the proposed anesthesia with the patient or authorized representative who has  indicated his/her understanding and acceptance.       Plan Discussed with: CRNA  Anesthesia Plan Comments:        Anesthesia Quick Evaluation

## 2019-05-16 NOTE — Progress Notes (Signed)
Progress Note  Patient Name: Maria Zuniga Date of Encounter: 05/16/2019  Primary Cardiologist: Peter Swaz[place], MD   Subjective   Patient doing well this morning. Echo performed. No chest pain or shortness of breath. Plan for CABG tomorrow.  Inpatient Medications    Scheduled Meds: . aspirin  81 mg Oral Daily  . atorvastatin  80 mg Oral q1800  . carvedilol  6.25 mg Oral BID WC  . Chlorhexidine Gluconate Cloth  6 each Topical Daily  . ezetimibe  10 mg Oral Daily  . fenofibrate  54 mg Oral Daily  . levothyroxine  100 mcg Oral QAC breakfast  . losartan  25 mg Oral Daily  . sodium chloride flush  3 mL Intravenous Q12H   Continuous Infusions: . sodium chloride Stopped (05/11/19 1604)  . sodium chloride 10 mL/hr at 05/15/19 1500  . heparin 1,250 Units/hr (05/15/19 1500)   PRN Meds: sodium chloride, acetaminophen, nitroGLYCERIN, ondansetron (ZOFRAN) IV, perflutren lipid microspheres (DEFINITY) IV suspension, sodium chloride flush   Vital Signs    Vitals:   05/15/19 2105 05/16/19 0014 05/16/19 0549 05/16/19 0800  BP: 116/66 104/61 109/63 118/65  Pulse: 71 71 72 69  Resp: 16 15 14    Temp: 98.6 F (37 C) 98.7 F (37.1 C) 97.9 F (36.6 C) 98.3 F (36.8 C)  TempSrc: Oral Oral Oral Oral  SpO2: 96% 98% 98% 96%  Weight:   62.5 kg   Height:        Intake/Output Summary (Last 24 hours) at 05/16/2019 0941 Last data filed at 05/16/2019 07/16/2019 Gross per 24 hour  Intake 425.98 ml  Output -  Net 425.98 ml   Last 3 Weights 05/16/2019 05/15/2019 05/14/2019  Weight (lbs) 137 lb 11.2 oz 137 lb 139 lb  Weight (kg) 62.46 kg 62.143 kg 63.05 kg      Telemetry    NSR, HR 60-70s, no other arrhythmias noted - Personally Reviewed  ECG     No new- Personally Reviewed  Physical Exam   GEN: No acute distress.   Neck: No JVD Cardiac: RRR, no murmurs, rubs, or gallops.  Respiratory: Clear to auscultation bilaterally. GI: Soft, nontender, non-distended  MS: No edema; No deformity.  Neuro:  Nonfocal  Psych: Normal affect   Labs    High Sensitivity Troponin:   Recent Labs  Lab 05/11/19 1323 05/11/19 1337  TROPONINIHS >27,000* >27,000*      Chemistry Recent Labs  Lab 05/11/19 1337  05/12/19 0940 05/15/19 0135 05/16/19 0310  NA 140   < > 140 136 138  K 4.0   < > 4.3 4.5 4.1  CL 107   < > 108 106 104  CO2 22  --  20* 22 22  GLUCOSE 108*   < > 151* 115* 106*  BUN 14   < > 8 16 19   CREATININE 0.75   < > 0.76 1.04* 0.96  CALCIUM 9.8  --  9.3 9.5 9.3  PROT 7.2  --   --   --  6.5  ALBUMIN 4.3  --   --   --  3.4*  AST 158*  --   --   --  18  ALT 45*  --   --   --  20  ALKPHOS 68  --   --   --  54  BILITOT 0.3  --   --   --  0.7  GFRNONAA >60  --  >60 >60 >60  GFRAA >60  --  >60 >60 >  60  ANIONGAP 11  --  12 8 12    < > = values in this interval not displayed.     Hematology Recent Labs  Lab 05/14/19 0212 05/15/19 0135 05/16/19 0310  WBC 10.5 10.2 9.1  RBC 4.03 4.23 4.18  HGB 13.0 13.6 13.3  HCT 37.4 39.6 39.0  MCV 92.8 93.6 93.3  MCH 32.3 32.2 31.8  MCHC 34.8 34.3 34.1  RDW 13.6 13.6 13.5  PLT 283 323 341    BNP Recent Labs  Lab 05/11/19 1633  BNP 265.6*     DDimer No results for input(s): DDIMER in the last 168 hours.   Radiology    No results found.  Cardiac Studies   Echo pending  LHC 05/12/19:  Ost LM to Dist LM lesion is 75% stenosed.  Ost LAD lesion is 95% stenosed.  Ost Cx lesion is 60% stenosed.  Prox RCA lesion is 35% stenosed.  There is moderate left ventricular systolic dysfunction.  LV end diastolic pressure is moderately elevated.  The left ventricular ejection fraction is 35-45% by visual estimate.  1. Severe left main and critical ostial LAD stenosis. Severe dampening of pressures with any catheter engagement.  2. Moderately elevated LVDEP.  3. Moderate LV dysfunction. EF estimated at 35-40% with anteroapical wall motion abnormality.  Echocardiogram on May 12, 2019 IMPRESSIONS  1. Left  ventricular ejection fraction, by visual estimation, is 35%. The left ventricle has moderately decreased function. Normal left ventricular size. There is mildly increased left ventricular hypertrophy. 2. Multiple segmental abnormalities exist. See findings. 3. Definity contrast agent was given IV to delineate the left ventricular endocardial borders. There is loosely organized thrombus at the apex. 4. Global right ventricle has normal systolic function.The right ventricular size is normal. No increase in right ventricular wall thickness. 5. Left atrial size was normal. 6. Right atrial size was normal. 7. The mitral valve is grossly normal. Trace mitral valve regurgitation. 8. The tricuspid valve is grossly normal. Tricuspid valve regurgitation is trivial. 9. The pulmonic valve was grossly normal. Pulmonic valve regurgitation is not visualized by color flow Doppler. 10. The inferior vena cava is normal in size with greater than 50% respiratory variability, suggesting right atrial pressure of 3 mmHg. 11. The aortic valve was not well visualized Aortic valve regurgitation was not visualized by color flow Doppler. 12. TR signal is inadequate for assessing pulmonary artery systolic pressure.  Patient Profile     53 y.o. female with HTN, Hyperlipidemia, prior tobacco use and family history of CAD here with subacute STEMI  Assessment & Plan    SubacuteSTEMI - Presented withover 2 weeks ofanginal symptoms 05/11/19. EKG showed dynamic ST elevations - Cath showed 75% ostial, 60% LC, and 35% RCA - Patient was seen by CTTS and is awaiting CABG. - Echo showed EF 35% and concern for apical thrombus in the left ventricle. CTTS plan for surgery on Friday with repeat echo today. - Denies recurrent chest pain - Continue ASA, IV heparin,carvedilol, statin, zetia fenofibrate. Losartan started this admission. Pressures so far stable.  Hyperlipidemia - LDL 54 - Continue atorvastatin, Zetia,  fenofibrate  Hypertension - BP this AM 118/65 - Continue carvedilol 6.25 mg daily - Losartan 25 mg added. creatinine stable  Tobacco abuse - Quit one year ago  For questions or updates, please contact [place] Please consult [email or web address] for contact info under        Signed, Bocephus Cali Ninfa Meeker, PA-C  05/16/2019, 9:41 AM

## 2019-05-16 NOTE — Progress Notes (Signed)
  Echocardiogram 2D Echocardiogram limited with Definity has been performed.  Jennette Dubin 05/16/2019, 9:12 AM

## 2019-05-16 NOTE — Progress Notes (Signed)
ANTICOAGULATION CONSULT NOTE - Follow Up Consult  Pharmacy Consult for heparin Indication: chest pain/ACS  No Known Allergies  Patient Measurements: Height: 5\' 3"  (160 cm) Weight: 137 lb 11.2 oz (62.5 kg) IBW/kg (Calculated) : 52.4   Vital Signs: Temp: 97.9 F (36.6 C) (10/08 0549) Temp Source: Oral (10/08 0549) BP: 109/63 (10/08 0549) Pulse Rate: 72 (10/08 0549)  Labs: Recent Labs    05/14/19 0212 05/15/19 0135 05/16/19 0310  HGB 13.0 13.6 13.3  HCT 37.4 39.6 39.0  PLT 283 323 341  HEPARINUNFRC 0.52 0.55 0.55  CREATININE  --  1.04* 0.96    Estimated Creatinine Clearance: 54.8 mL/min (by C-G formula based on SCr of 0.96 mg/dL).   Medical History: Past Medical History:  Diagnosis Date  . Hyperlipidemia   . Hypertension   . Thyroid disease     Medications:  Medications Prior to Admission  Medication Sig Dispense Refill Last Dose  . atorvastatin (LIPITOR) 40 MG tablet Take 40 mg by mouth daily.   05/10/2019 at Unknown time  . ezetimibe (ZETIA) 10 MG tablet Take 10 mg by mouth daily.   05/10/2019 at Unknown time  . fenofibrate 54 MG tablet Take 54 mg by mouth daily.   05/10/2019 at Unknown time  . levothyroxine (SYNTHROID) 100 MCG tablet Take 100 mcg by mouth daily before breakfast.   05/11/2019 at Unknown time  . furosemide (LASIX) 20 MG tablet Take 20 mg by mouth daily as needed for fluid or edema.   Not Taking at Unknown time    Assessment: 53 yo female with STEMI post cath with severe left main and critical ostial LAD stenosis. Patient for CABG consulted and pharmacy to dose heparin (begin 8 hours post sheath removal). Not on anticoagulants PTA.   LV thrombus on Echo. Plan is to continue IV heparin for  prior to CABG (scheduled for 10/9), per Cardiology note 10/5.  Heparin level remains therapeutic this AM at 0.55 on 1250 units/hr. CBC ok, no mention of bleeding or line issues.   Goal of Therapy:  Heparin level 0.3-0.7 units/ml Monitor platelets by  anticoagulation protocol: Yes   Plan:  Continue heparin rate at 1250 units/hr  Daily heparin level and CBC Monitor for s/sx of bleeding Will follow CABG timing   Kennon Holter, PharmD PGY1 Ambulatory Care Pharmacy Resident [employer] Phone: [phone]

## 2019-05-16 NOTE — Progress Notes (Signed)
[address]       [place] [address]             [phone]                 5 Days Post-Op Procedure(s) (LRB): LEFT HEART CATH AND CORONARY ANGIOGRAPHY (N/A)  LOS: 5 days   Subjective: Patient comfortable, denies sob or chest pain echo done this morning while I was seeing patient and reviewed   Objective: Vital signs in last 24 hours: Patient Vitals for the past 24 hrs:  BP Temp Temp src Pulse Resp SpO2 Weight  05/16/19 1632 115/62 98.1 F (36.7 C) Oral 67 16 96 % -  05/16/19 0800 118/65 98.3 F (36.8 C) Oral 69 - 96 % -  05/16/19 0549 109/63 97.9 F (36.6 C) Oral 72 14 98 % 62.5 kg  05/16/19 0014 104/61 98.7 F (37.1 C) Oral 71 15 98 % -  05/15/19 2105 116/66 98.6 F (37 C) Oral 71 16 96 % -    Filed Weights   05/14/19 0352 05/15/19 0449 05/16/19 0549  Weight: 63 kg 62.1 kg 62.5 kg    Hemodynamic parameters for last 24 hours:    Intake/Output from previous day: 10/07 0701 - 10/08 0700 In: 1055 [I.V.:1055] Out: -  Intake/Output this shift: Total I/O In: 286.1 [I.V.:286.1] Out: -   Scheduled Meds: . aspirin  81 mg Oral Daily  . atorvastatin  80 mg Oral q1800  . carvedilol  6.25 mg Oral BID WC  . [START ON 05/17/2019] chlorhexidine  15 mL Mouth/Throat Once  . Chlorhexidine Gluconate Cloth  6 each Topical Daily  . Chlorhexidine Gluconate Cloth  6 each Topical Once   And  . Chlorhexidine Gluconate Cloth  6 each Topical Once  . [START ON 05/17/2019] epinephrine  0-10 mcg/min Intravenous To OR  . ezetimibe  10 mg Oral Daily  . [START ON 05/17/2019] heparin-papaverine-plasmalyte irrigation   Irrigation To OR  . [START ON 05/17/2019] insulin   Intravenous To OR  . levothyroxine  100 mcg Oral QAC breakfast  . losartan  25 mg Oral Daily  . [START ON 05/17/2019] magnesium sulfate  40 mEq Other To OR  . [START ON 05/17/2019] phenylephrine  30-200 mcg/min Intravenous To OR  . [START ON 05/17/2019] potassium chloride  80 mEq Other To OR  . sodium  chloride flush  3 mL Intravenous Q12H  . [START ON 05/17/2019] tranexamic acid  15 mg/kg Intravenous To OR  . [START ON 05/17/2019] tranexamic acid  2 mg/kg Intracatheter To OR   Continuous Infusions: . sodium chloride Stopped (05/11/19 1604)  . sodium chloride 10 mL/hr at 05/16/19 1602  . [START ON 05/17/2019] cefUROXime (ZINACEF)  IV    . [START ON 05/17/2019] cefUROXime (ZINACEF)  IV    . [START ON 05/17/2019] dexmedetomidine    . [START ON 05/17/2019] DOPamine    . [START ON 05/17/2019] heparin 30,000 units/NS 1000 mL solution for CELLSAVER    . heparin 1,250 Units/hr (05/16/19 1602)  . [START ON 05/17/2019] milrinone    . [START ON 05/17/2019] nitroGLYCERIN    . [START ON 05/17/2019] tranexamic acid (CYKLOKAPRON) infusion ([facility])    . [START ON 05/17/2019] vancomycin     PRN Meds:.sodium chloride, acetaminophen, nitroGLYCERIN, ondansetron (ZOFRAN) IV, sodium chloride flush, temazepam  General appearance: alert, cooperative and no distress Neurologic: intact Heart: regular rate and rhythm, S1, S2 normal, no murmur, click, rub or gallop Lungs: clear to  auscultation bilaterally Abdomen: soft, non-tender; bowel sounds normal; no masses,  no organomegaly Extremities: extremities normal, atraumatic, no cyanosis or edema  Lab Results: CBC: Recent Labs    05/15/19 0135 05/16/19 0310  WBC 10.2 9.1  HGB 13.6 13.3  HCT 39.6 39.0  PLT 323 341   BMET:  Recent Labs    05/15/19 0135 05/16/19 0310  NA 136 138  K 4.5 4.1  CL 106 104  CO2 22 22  GLUCOSE 115* 106*  BUN 16 19  CREATININE 1.04* 0.96  CALCIUM 9.5 9.3    PT/INR: No results for input(s): LABPROT, INR in the last 72 hours.   Radiology No results found.   Assessment/Plan: S/P Procedure(s) (LRB): LEFT HEART CATH AND CORONARY ANGIOGRAPHY (N/A) Plan to proceed with CABG tomorrow .The goals risks and alternatives of the planned surgical procedure Procedure(s): LEFT HEART CATH AND CORONARY ANGIOGRAPHY (N/A)  have been  discussed with the patient in detail. The risks of the procedure including death, infection, stroke, myocardial infarction, bleeding, blood transfusion have all been discussed specifically.  I have quoted Maria Zuniga a 2 % of perioperative mortality and a complication rate as high as 40 %. The patient's questions have been answered.Maria Zuniga is willing  to proceed with the planned procedure.   [name] MD 05/16/2019 5:52 PM

## 2019-05-16 NOTE — Plan of Care (Signed)
  Problem: Health Behavior/Discharge Planning: Goal: Ability to manage health-related needs will improve Outcome: Progressing   

## 2019-05-17 ENCOUNTER — Inpatient Hospital Stay (HOSPITAL_COMMUNITY): Payer: 59 | Admitting: Anesthesiology

## 2019-05-17 ENCOUNTER — Inpatient Hospital Stay (HOSPITAL_COMMUNITY): Payer: 59

## 2019-05-17 ENCOUNTER — Encounter (HOSPITAL_COMMUNITY)
Admission: EM | Disposition: A | Discharge status: Home / Self Care | Payer: Self-pay | Source: Home / Self Care | Attending: Cardiology

## 2019-05-17 DIAGNOSIS — I2510 Atherosclerotic heart disease of native coronary artery without angina pectoris: Secondary | ICD-10-CM | POA: Diagnosis present

## 2019-05-17 DIAGNOSIS — Z951 Presence of aortocoronary bypass graft: Secondary | ICD-10-CM

## 2019-05-17 DIAGNOSIS — I25110 Atherosclerotic heart disease of native coronary artery with unstable angina pectoris: Secondary | ICD-10-CM

## 2019-05-17 HISTORY — DX: Atherosclerotic heart disease of native coronary artery without angina pectoris: I25.10

## 2019-05-17 HISTORY — PX: TEE WITHOUT CARDIOVERSION: SHX5443

## 2019-05-17 HISTORY — DX: Presence of aortocoronary bypass graft: Z95.1

## 2019-05-17 HISTORY — PX: CORONARY ARTERY BYPASS GRAFT: SHX141

## 2019-05-17 LAB — POCT I-STAT, CHEM 8
BUN: 19 mg/dL (ref 6–20)
BUN: 16 mg/dL (ref 6–20)
BUN: 18 mg/dL (ref 6–20)
Calcium, Ion: 1.29 mmol/L (ref 1.15–1.40)
Calcium, Ion: 0.96 mmol/L — ABNORMAL LOW (ref 1.15–1.40)
Calcium, Ion: 1.23 mmol/L (ref 1.15–1.40)
Chloride: 104 mmol/L (ref 98–111)
Chloride: 101 mmol/L (ref 98–111)
Chloride: 106 mmol/L (ref 98–111)
Creatinine, Ser: 0.7 mg/dL (ref 0.44–1.00)
Creatinine, Ser: 0.5 mg/dL (ref 0.44–1.00)
Creatinine, Ser: 0.6 mg/dL (ref 0.44–1.00)
Glucose, Bld: 109 mg/dL — ABNORMAL HIGH (ref 70–99)
Glucose, Bld: 116 mg/dL — ABNORMAL HIGH (ref 70–99)
Glucose, Bld: 97 mg/dL (ref 70–99)
HCT: 38 % (ref 36.0–46.0)
HCT: 26 % — ABNORMAL LOW (ref 36.0–46.0)
HCT: 33 % — ABNORMAL LOW (ref 36.0–46.0)
Hemoglobin: 12.9 g/dL (ref 12.0–15.0)
Hemoglobin: 11.2 g/dL — ABNORMAL LOW (ref 12.0–15.0)
Hemoglobin: 8.8 g/dL — ABNORMAL LOW (ref 12.0–15.0)
Potassium: 4.5 mmol/L (ref 3.5–5.1)
Potassium: 4.5 mmol/L (ref 3.5–5.1)
Potassium: 4.7 mmol/L (ref 3.5–5.1)
Sodium: 140 mmol/L (ref 135–145)
Sodium: 138 mmol/L (ref 135–145)
Sodium: 140 mmol/L (ref 135–145)
TCO2: 25 mmol/L (ref 22–32)
TCO2: 22 mmol/L (ref 22–32)
TCO2: 26 mmol/L (ref 22–32)

## 2019-05-17 LAB — PLATELET COUNT: Platelets: 196 10*3/uL (ref 150–400)

## 2019-05-17 LAB — ECHO INTRAOPERATIVE TEE
Height: 63 in
Weight: 2115.2 oz

## 2019-05-17 LAB — POCT I-STAT 7, (LYTES, BLD GAS, ICA,H+H)
Acid-Base Excess: 1 mmol/L (ref 0.0–2.0)
Acid-base deficit: 1 mmol/L (ref 0.0–2.0)
Acid-base deficit: 5 mmol/L — ABNORMAL HIGH (ref 0.0–2.0)
Acid-base deficit: 5 mmol/L — ABNORMAL HIGH (ref 0.0–2.0)
Acid-base deficit: 4 mmol/L — ABNORMAL HIGH (ref 0.0–2.0)
Acid-base deficit: 5 mmol/L — ABNORMAL HIGH (ref 0.0–2.0)
Bicarbonate: 24.9 mmol/L (ref 20.0–28.0)
Bicarbonate: 24.5 mmol/L (ref 20.0–28.0)
Bicarbonate: 20.6 mmol/L (ref 20.0–28.0)
Bicarbonate: 21.2 mmol/L (ref 20.0–28.0)
Bicarbonate: 21.8 mmol/L (ref 20.0–28.0)
Bicarbonate: 20.2 mmol/L (ref 20.0–28.0)
Calcium, Ion: 1.33 mmol/L (ref 1.15–1.40)
Calcium, Ion: 0.97 mmol/L — ABNORMAL LOW (ref 1.15–1.40)
Calcium, Ion: 1.14 mmol/L — ABNORMAL LOW (ref 1.15–1.40)
Calcium, Ion: 1.15 mmol/L (ref 1.15–1.40)
Calcium, Ion: 1.21 mmol/L (ref 1.15–1.40)
Calcium, Ion: 1.21 mmol/L (ref 1.15–1.40)
HCT: 41 % (ref 36.0–46.0)
HCT: 26 % — ABNORMAL LOW (ref 36.0–46.0)
HCT: 29 % — ABNORMAL LOW (ref 36.0–46.0)
HCT: 29 % — ABNORMAL LOW (ref 36.0–46.0)
HCT: 26 % — ABNORMAL LOW (ref 36.0–46.0)
HCT: 27 % — ABNORMAL LOW (ref 36.0–46.0)
Hemoglobin: 13.9 g/dL (ref 12.0–15.0)
Hemoglobin: 8.8 g/dL — ABNORMAL LOW (ref 12.0–15.0)
Hemoglobin: 9.9 g/dL — ABNORMAL LOW (ref 12.0–15.0)
Hemoglobin: 9.9 g/dL — ABNORMAL LOW (ref 12.0–15.0)
Hemoglobin: 8.8 g/dL — ABNORMAL LOW (ref 12.0–15.0)
Hemoglobin: 9.2 g/dL — ABNORMAL LOW (ref 12.0–15.0)
O2 Saturation: 100 %
O2 Saturation: 100 %
O2 Saturation: 98 %
O2 Saturation: 98 %
O2 Saturation: 98 %
O2 Saturation: 97 %
Patient temperature: 34.7
Patient temperature: 37.1
Patient temperature: 37
Potassium: 4.6 mmol/L (ref 3.5–5.1)
Potassium: 4.5 mmol/L (ref 3.5–5.1)
Potassium: 4.8 mmol/L (ref 3.5–5.1)
Potassium: 4.2 mmol/L (ref 3.5–5.1)
Potassium: 4.4 mmol/L (ref 3.5–5.1)
Potassium: 4.5 mmol/L (ref 3.5–5.1)
Sodium: 140 mmol/L (ref 135–145)
Sodium: 140 mmol/L (ref 135–145)
Sodium: 138 mmol/L (ref 135–145)
Sodium: 140 mmol/L (ref 135–145)
Sodium: 141 mmol/L (ref 135–145)
Sodium: 141 mmol/L (ref 135–145)
TCO2: 26 mmol/L (ref 22–32)
TCO2: 26 mmol/L (ref 22–32)
TCO2: 22 mmol/L (ref 22–32)
TCO2: 22 mmol/L (ref 22–32)
TCO2: 23 mmol/L (ref 22–32)
TCO2: 21 mmol/L — ABNORMAL LOW (ref 22–32)
pCO2 arterial: 46 mmHg (ref 32.0–48.0)
pCO2 arterial: 35.7 mmHg (ref 32.0–48.0)
pCO2 arterial: 38 mmHg (ref 32.0–48.0)
pCO2 arterial: 37.8 mmHg (ref 32.0–48.0)
pCO2 arterial: 42.8 mmHg (ref 32.0–48.0)
pCO2 arterial: 39.3 mmHg (ref 32.0–48.0)
pH, Arterial: 7.342 — ABNORMAL LOW (ref 7.350–7.450)
pH, Arterial: 7.445 (ref 7.350–7.450)
pH, Arterial: 7.342 — ABNORMAL LOW (ref 7.350–7.450)
pH, Arterial: 7.345 — ABNORMAL LOW (ref 7.350–7.450)
pH, Arterial: 7.314 — ABNORMAL LOW (ref 7.350–7.450)
pH, Arterial: 7.32 — ABNORMAL LOW (ref 7.350–7.450)
pO2, Arterial: 519 mmHg — ABNORMAL HIGH (ref 83.0–108.0)
pO2, Arterial: 447 mmHg — ABNORMAL HIGH (ref 83.0–108.0)
pO2, Arterial: 103 mmHg (ref 83.0–108.0)
pO2, Arterial: 99 mmHg (ref 83.0–108.0)
pO2, Arterial: 108 mmHg (ref 83.0–108.0)
pO2, Arterial: 103 mmHg (ref 83.0–108.0)

## 2019-05-17 LAB — GLUCOSE, CAPILLARY
Glucose-Capillary: 139 mg/dL — ABNORMAL HIGH (ref 70–99)
Glucose-Capillary: 130 mg/dL — ABNORMAL HIGH (ref 70–99)
Glucose-Capillary: 119 mg/dL — ABNORMAL HIGH (ref 70–99)
Glucose-Capillary: 117 mg/dL — ABNORMAL HIGH (ref 70–99)
Glucose-Capillary: 109 mg/dL — ABNORMAL HIGH (ref 70–99)

## 2019-05-17 LAB — HEPARIN LEVEL (UNFRACTIONATED): Heparin Unfractionated: 0.6 IU/mL (ref 0.30–0.70)

## 2019-05-17 LAB — CBC
HCT: 40.3 % (ref 36.0–46.0)
HCT: 28.8 % — ABNORMAL LOW (ref 36.0–46.0)
HCT: 27.3 % — ABNORMAL LOW (ref 36.0–46.0)
Hemoglobin: 13.7 g/dL (ref 12.0–15.0)
Hemoglobin: 9.7 g/dL — ABNORMAL LOW (ref 12.0–15.0)
Hemoglobin: 9.2 g/dL — ABNORMAL LOW (ref 12.0–15.0)
MCH: 31.9 pg (ref 26.0–34.0)
MCH: 32.2 pg (ref 26.0–34.0)
MCH: 32.3 pg (ref 26.0–34.0)
MCHC: 34 g/dL (ref 30.0–36.0)
MCHC: 33.7 g/dL (ref 30.0–36.0)
MCHC: 33.7 g/dL (ref 30.0–36.0)
MCV: 93.9 fL (ref 80.0–100.0)
MCV: 95.7 fL (ref 80.0–100.0)
MCV: 95.8 fL (ref 80.0–100.0)
Platelets: 351 10*3/uL (ref 150–400)
Platelets: 187 10*3/uL (ref 150–400)
Platelets: 202 10*3/uL (ref 150–400)
RBC: 4.29 MIL/uL (ref 3.87–5.11)
RBC: 3.01 MIL/uL — ABNORMAL LOW (ref 3.87–5.11)
RBC: 2.85 MIL/uL — ABNORMAL LOW (ref 3.87–5.11)
RDW: 13.4 % (ref 11.5–15.5)
RDW: 13.4 % (ref 11.5–15.5)
RDW: 13.2 % (ref 11.5–15.5)
WBC: 8.7 10*3/uL (ref 4.0–10.5)
WBC: 12 10*3/uL — ABNORMAL HIGH (ref 4.0–10.5)
WBC: 11.2 10*3/uL — ABNORMAL HIGH (ref 4.0–10.5)
nRBC: 0 % (ref 0.0–0.2)
nRBC: 0 % (ref 0.0–0.2)
nRBC: 0 % (ref 0.0–0.2)

## 2019-05-17 LAB — BASIC METABOLIC PANEL
Anion gap: 11 (ref 5–15)
Anion gap: 7 (ref 5–15)
BUN: 18 mg/dL (ref 6–20)
BUN: 16 mg/dL (ref 6–20)
CO2: 23 mmol/L (ref 22–32)
CO2: 21 mmol/L — ABNORMAL LOW (ref 22–32)
Calcium: 9.7 mg/dL (ref 8.9–10.3)
Calcium: 8 mg/dL — ABNORMAL LOW (ref 8.9–10.3)
Chloride: 105 mmol/L (ref 98–111)
Chloride: 110 mmol/L (ref 98–111)
Creatinine, Ser: 0.9 mg/dL (ref 0.44–1.00)
Creatinine, Ser: 0.79 mg/dL (ref 0.44–1.00)
GFR calc Af Amer: >60 mL/min (ref >60)
GFR calc Af Amer: >60 mL/min (ref >60)
GFR calc non Af Amer: >60 mL/min (ref >60)
GFR calc non Af Amer: >60 mL/min (ref >60)
Glucose, Bld: 116 mg/dL — ABNORMAL HIGH (ref 70–99)
Glucose, Bld: 122 mg/dL — ABNORMAL HIGH (ref 70–99)
Potassium: 4.6 mmol/L (ref 3.5–5.1)
Potassium: 4.4 mmol/L (ref 3.5–5.1)
Sodium: 139 mmol/L (ref 135–145)
Sodium: 138 mmol/L (ref 135–145)

## 2019-05-17 LAB — PROTIME-INR
INR: 1.3 — ABNORMAL HIGH (ref 0.8–1.2)
Prothrombin Time: 15.6 seconds — ABNORMAL HIGH (ref 11.4–15.2)

## 2019-05-17 LAB — HEMOGLOBIN AND HEMATOCRIT, BLOOD
HCT: 24.6 % — ABNORMAL LOW (ref 36.0–46.0)
Hemoglobin: 8.3 g/dL — ABNORMAL LOW (ref 12.0–15.0)

## 2019-05-17 LAB — APTT: aPTT: 33 seconds (ref 24–36)

## 2019-05-17 LAB — MAGNESIUM: Magnesium: 3.3 mg/dL — ABNORMAL HIGH (ref 1.7–2.4)

## 2019-05-17 LAB — POCT I-STAT 4, (NA,K, GLUC, HGB,HCT)
Glucose, Bld: 134 mg/dL — ABNORMAL HIGH (ref 70–99)
HCT: 27 % — ABNORMAL LOW (ref 36.0–46.0)
Hemoglobin: 9.2 g/dL — ABNORMAL LOW (ref 12.0–15.0)
Potassium: 4.8 mmol/L (ref 3.5–5.1)
Sodium: 137 mmol/L (ref 135–145)

## 2019-05-17 SURGERY — CORONARY ARTERY BYPASS GRAFTING (CABG)
Anesthesia: General | Site: Chest

## 2019-05-17 MED ORDER — ONDANSETRON HCL 4 MG/2ML IJ SOLN
4.0000 mg | Freq: Four times a day (QID) | INTRAMUSCULAR | Status: DC | PRN
  Administered 2019-05-18 – 2019-05-19 (×2): 4 mg via INTRAVENOUS
  Filled 2019-05-17 (×2): qty 2

## 2019-05-17 MED ORDER — ROCURONIUM BROMIDE 10 MG/ML (PF) SYRINGE
PREFILLED_SYRINGE | INTRAVENOUS | Status: AC
  Filled 2019-05-17: qty 10

## 2019-05-17 MED ORDER — MAGNESIUM SULFATE 4 GM/100ML IV SOLN
4.0000 g | Freq: Once | INTRAVENOUS | Status: AC
  Administered 2019-05-17: 14:00:00 4 g via INTRAVENOUS
  Filled 2019-05-17: qty 100

## 2019-05-17 MED ORDER — DOPAMINE-DEXTROSE 3.2-5 MG/ML-% IV SOLN
INTRAVENOUS | Status: DC | PRN
  Administered 2019-05-17: 3 ug/kg/min via INTRAVENOUS

## 2019-05-17 MED ORDER — HEPARIN SODIUM (PORCINE) 1000 UNIT/ML IJ SOLN
INTRAMUSCULAR | Status: DC | PRN
  Administered 2019-05-17: 21000 [IU] via INTRAVENOUS

## 2019-05-17 MED ORDER — CHLORHEXIDINE GLUCONATE 0.12% ORAL RINSE (MEDLINE KIT): 15.0000 mL | Freq: Two times a day (BID) | OROMUCOSAL | Status: DC

## 2019-05-17 MED ORDER — 0.9 % SODIUM CHLORIDE (POUR BTL) OPTIME
TOPICAL | Status: DC | PRN
  Administered 2019-05-17: 07:00:00 6000 mL

## 2019-05-17 MED ORDER — LACTATED RINGERS IV SOLN: INTRAVENOUS | Status: DC

## 2019-05-17 MED ORDER — ASPIRIN 81 MG PO CHEW: 324.0000 mg | CHEWABLE_TABLET | Freq: Every day | ORAL | Status: DC

## 2019-05-17 MED ORDER — LACTATED RINGERS IV SOLN
INTRAVENOUS | Status: DC | PRN
  Administered 2019-05-17: 07:00:00 via INTRAVENOUS

## 2019-05-17 MED ORDER — ACETAMINOPHEN 500 MG PO TABS
1000.0000 mg | ORAL_TABLET | Freq: Four times a day (QID) | ORAL | Status: DC
  Administered 2019-05-18 – 2019-05-21 (×14): 1000 mg via ORAL
  Filled 2019-05-17 (×15): qty 2

## 2019-05-17 MED ORDER — OXYCODONE HCL 5 MG PO TABS
5.0000 mg | ORAL_TABLET | ORAL | Status: DC | PRN
  Administered 2019-05-18 – 2019-05-20 (×4): 10 mg via ORAL
  Filled 2019-05-17 (×4): qty 2

## 2019-05-17 MED ORDER — ACETAMINOPHEN 160 MG/5ML PO SOLN: 1000.0000 mg | Freq: Four times a day (QID) | ORAL | Status: DC

## 2019-05-17 MED ORDER — PANTOPRAZOLE SODIUM 40 MG PO TBEC
40.0000 mg | DELAYED_RELEASE_TABLET | Freq: Every day | ORAL | Status: DC
  Administered 2019-05-19 – 2019-05-21 (×3): 40 mg via ORAL
  Filled 2019-05-17 (×3): qty 1

## 2019-05-17 MED ORDER — FENTANYL CITRATE (PF) 250 MCG/5ML IJ SOLN
INTRAMUSCULAR | Status: DC | PRN
  Administered 2019-05-17 (×2): 50 ug via INTRAVENOUS
  Administered 2019-05-17 (×4): 100 ug via INTRAVENOUS
  Administered 2019-05-17: 200 ug via INTRAVENOUS
  Administered 2019-05-17 (×3): 100 ug via INTRAVENOUS
  Administered 2019-05-17: 50 ug via INTRAVENOUS
  Administered 2019-05-17 (×3): 100 ug via INTRAVENOUS

## 2019-05-17 MED ORDER — DEXMEDETOMIDINE HCL IN NACL 400 MCG/100ML IV SOLN
0.0000 ug/kg/h | INTRAVENOUS | Status: DC
  Administered 2019-05-17: 14:00:00 0.7 ug/kg/h via INTRAVENOUS

## 2019-05-17 MED ORDER — SODIUM CHLORIDE (PF) 0.9 % IJ SOLN
OROMUCOSAL | Status: DC | PRN
  Administered 2019-05-17 (×3): 4 mL via TOPICAL

## 2019-05-17 MED ORDER — INSULIN ASPART 100 UNIT/ML ~~LOC~~ SOLN
0.0000 [IU] | SUBCUTANEOUS | Status: DC
  Administered 2019-05-18 (×2): 2 [IU] via SUBCUTANEOUS

## 2019-05-17 MED ORDER — SODIUM CHLORIDE 0.9 % IV SOLN
INTRAVENOUS | Status: DC
  Administered 2019-05-17: 14:00:00 via INTRAVENOUS

## 2019-05-17 MED ORDER — LEVALBUTEROL HCL 0.63 MG/3ML IN NEBU
0.6300 mg | INHALATION_SOLUTION | Freq: Three times a day (TID) | RESPIRATORY_TRACT | Status: DC
  Administered 2019-05-17 – 2019-05-19 (×6): 0.63 mg via RESPIRATORY_TRACT
  Filled 2019-05-17 (×6): qty 3

## 2019-05-17 MED ORDER — FENTANYL CITRATE (PF) 250 MCG/5ML IJ SOLN
INTRAMUSCULAR | Status: AC
  Filled 2019-05-17: qty 25

## 2019-05-17 MED ORDER — SODIUM CHLORIDE 0.9% FLUSH
3.0000 mL | Freq: Two times a day (BID) | INTRAVENOUS | Status: DC
  Administered 2019-05-18 – 2019-05-20 (×5): 3 mL via INTRAVENOUS

## 2019-05-17 MED ORDER — SODIUM BICARBONATE 8.4 % IV SOLN
25.0000 meq | Freq: Once | INTRAVENOUS | Status: AC
  Administered 2019-05-17: 14:00:00 25 meq via INTRAVENOUS

## 2019-05-17 MED ORDER — NOREPINEPHRINE 4 MG/250ML-% IV SOLN
0.0000 ug/min | INTRAVENOUS | Status: DC
  Filled 2019-05-17: qty 250

## 2019-05-17 MED ORDER — INSULIN REGULAR(HUMAN) IN NACL 100-0.9 UT/100ML-% IV SOLN
INTRAVENOUS | Status: DC
  Administered 2019-05-17: 14:00:00 0.4 [IU]/h via INTRAVENOUS

## 2019-05-17 MED ORDER — DOCUSATE SODIUM 100 MG PO CAPS
200.0000 mg | ORAL_CAPSULE | Freq: Every day | ORAL | Status: DC
  Administered 2019-05-18 – 2019-05-21 (×4): 200 mg via ORAL
  Filled 2019-05-17 (×4): qty 2

## 2019-05-17 MED ORDER — SODIUM CHLORIDE 0.9 % IV SOLN
1.5000 g | Freq: Two times a day (BID) | INTRAVENOUS | Status: AC
  Administered 2019-05-17 – 2019-05-19 (×4): 1.5 g via INTRAVENOUS
  Filled 2019-05-17 (×4): qty 1.5

## 2019-05-17 MED ORDER — DOPAMINE-DEXTROSE 3.2-5 MG/ML-% IV SOLN: 0.0000 ug/kg/min | INTRAVENOUS | Status: DC

## 2019-05-17 MED ORDER — POTASSIUM CHLORIDE 10 MEQ/50ML IV SOLN: 10.0000 meq | INTRAVENOUS | Status: AC

## 2019-05-17 MED ORDER — MILRINONE LACTATE IN DEXTROSE 20-5 MG/100ML-% IV SOLN
0.0000 ug/kg/min | INTRAVENOUS | Status: DC
  Administered 2019-05-17 – 2019-05-18 (×2): 0.3 ug/kg/min via INTRAVENOUS
  Filled 2019-05-17 (×2): qty 100

## 2019-05-17 MED ORDER — PROPOFOL 10 MG/ML IV BOLUS
INTRAVENOUS | Status: AC
  Filled 2019-05-17: qty 20

## 2019-05-17 MED ORDER — MIDAZOLAM HCL 2 MG/2ML IJ SOLN: 2.0000 mg | INTRAMUSCULAR | Status: DC | PRN

## 2019-05-17 MED ORDER — ALBUMIN HUMAN 5 % IV SOLN
INTRAVENOUS | Status: DC | PRN
  Administered 2019-05-17: 12:00:00 via INTRAVENOUS

## 2019-05-17 MED ORDER — PHENYLEPHRINE 40 MCG/ML (10ML) SYRINGE FOR IV PUSH (FOR BLOOD PRESSURE SUPPORT)
PREFILLED_SYRINGE | INTRAVENOUS | Status: AC
  Filled 2019-05-17: qty 10

## 2019-05-17 MED ORDER — SODIUM CHLORIDE 0.9% FLUSH
10.0000 mL | Freq: Two times a day (BID) | INTRAVENOUS | Status: DC
  Administered 2019-05-17 – 2019-05-19 (×6): 10 mL

## 2019-05-17 MED ORDER — ORAL CARE MOUTH RINSE
15.0000 mL | OROMUCOSAL | Status: DC
  Administered 2019-05-17 (×2): 15 mL via OROMUCOSAL

## 2019-05-17 MED ORDER — ARTIFICIAL TEARS OPHTHALMIC OINT
TOPICAL_OINTMENT | OPHTHALMIC | Status: AC
  Filled 2019-05-17: qty 3.5

## 2019-05-17 MED ORDER — METOPROLOL TARTRATE 12.5 MG HALF TABLET
12.5000 mg | ORAL_TABLET | Freq: Two times a day (BID) | ORAL | Status: DC
  Administered 2019-05-18 – 2019-05-20 (×6): 12.5 mg via ORAL
  Filled 2019-05-17 (×6): qty 1

## 2019-05-17 MED ORDER — SODIUM CHLORIDE 0.9% FLUSH: 10.0000 mL | INTRAVENOUS | Status: DC | PRN

## 2019-05-17 MED ORDER — ACETAMINOPHEN 650 MG RE SUPP
650.0000 mg | Freq: Once | RECTAL | Status: AC
  Administered 2019-05-17: 650 mg via RECTAL

## 2019-05-17 MED ORDER — MILRINONE LACTATE IN DEXTROSE 20-5 MG/100ML-% IV SOLN
INTRAVENOUS | Status: DC | PRN
  Administered 2019-05-17: .3 ug/kg/min via INTRAVENOUS

## 2019-05-17 MED ORDER — PROPOFOL 10 MG/ML IV BOLUS
INTRAVENOUS | Status: DC | PRN
  Administered 2019-05-17: 50 mg via INTRAVENOUS

## 2019-05-17 MED ORDER — ALBUMIN HUMAN 5 % IV SOLN
250.0000 mL | INTRAVENOUS | Status: DC | PRN
  Administered 2019-05-17 (×3): 12.5 g via INTRAVENOUS
  Filled 2019-05-17: qty 250

## 2019-05-17 MED ORDER — BISACODYL 10 MG RE SUPP: 10.0000 mg | Freq: Every day | RECTAL | Status: DC

## 2019-05-17 MED ORDER — LACTATED RINGERS IV SOLN: 500.0000 mL | Freq: Once | INTRAVENOUS | Status: DC | PRN

## 2019-05-17 MED ORDER — PROTAMINE SULFATE 10 MG/ML IV SOLN
INTRAVENOUS | Status: AC
  Filled 2019-05-17: qty 25

## 2019-05-17 MED ORDER — METOPROLOL TARTRATE 25 MG/10 ML ORAL SUSPENSION: 12.5000 mg | Freq: Two times a day (BID) | ORAL | Status: DC

## 2019-05-17 MED ORDER — PHENYLEPHRINE HCL-NACL 20-0.9 MG/250ML-% IV SOLN
0.0000 ug/min | INTRAVENOUS | Status: DC
  Administered 2019-05-17: 14:00:00 10 ug/min via INTRAVENOUS
  Administered 2019-05-18: 02:00:00 15 ug/min via INTRAVENOUS
  Filled 2019-05-17: qty 250

## 2019-05-17 MED ORDER — ONDANSETRON HCL 4 MG/2ML IJ SOLN
INTRAMUSCULAR | Status: AC
  Filled 2019-05-17: qty 2

## 2019-05-17 MED ORDER — TRAMADOL HCL 50 MG PO TABS
50.0000 mg | ORAL_TABLET | ORAL | Status: DC | PRN
  Administered 2019-05-18 (×2): 50 mg via ORAL
  Filled 2019-05-17 (×2): qty 1

## 2019-05-17 MED ORDER — LACTATED RINGERS IV SOLN
INTRAVENOUS | Status: DC
  Administered 2019-05-17: 14:00:00 via INTRAVENOUS

## 2019-05-17 MED ORDER — FENTANYL CITRATE (PF) 250 MCG/5ML IJ SOLN
INTRAMUSCULAR | Status: AC
  Filled 2019-05-17: qty 5

## 2019-05-17 MED ORDER — ROCURONIUM BROMIDE 10 MG/ML (PF) SYRINGE
PREFILLED_SYRINGE | INTRAVENOUS | Status: DC | PRN
  Administered 2019-05-17: 100 mg via INTRAVENOUS
  Administered 2019-05-17 (×2): 50 mg via INTRAVENOUS

## 2019-05-17 MED ORDER — METOPROLOL TARTRATE 12.5 MG HALF TABLET: 12.5000 mg | ORAL_TABLET | Freq: Two times a day (BID) | ORAL | Status: DC

## 2019-05-17 MED ORDER — CHLORHEXIDINE GLUCONATE 0.12 % MT SOLN
15.0000 mL | OROMUCOSAL | Status: AC
  Administered 2019-05-17: 15 mL via OROMUCOSAL

## 2019-05-17 MED ORDER — BISACODYL 5 MG PO TBEC
10.0000 mg | DELAYED_RELEASE_TABLET | Freq: Every day | ORAL | Status: DC
  Administered 2019-05-18 – 2019-05-21 (×4): 10 mg via ORAL
  Filled 2019-05-17 (×4): qty 2

## 2019-05-17 MED ORDER — NITROGLYCERIN IN D5W 200-5 MCG/ML-% IV SOLN: 0.0000 ug/min | INTRAVENOUS | Status: DC

## 2019-05-17 MED ORDER — SODIUM CHLORIDE 0.45 % IV SOLN
INTRAVENOUS | Status: DC | PRN
  Administered 2019-05-17: 13:00:00 via INTRAVENOUS

## 2019-05-17 MED ORDER — SODIUM CHLORIDE 0.9 % IV SOLN: 250.0000 mL | INTRAVENOUS | Status: DC

## 2019-05-17 MED ORDER — ACETAMINOPHEN 160 MG/5ML PO SOLN: 650.0000 mg | Freq: Once | ORAL | Status: AC

## 2019-05-17 MED ORDER — SODIUM CHLORIDE 0.9% FLUSH: 3.0000 mL | INTRAVENOUS | Status: DC | PRN

## 2019-05-17 MED ORDER — INSULIN REGULAR BOLUS VIA INFUSION
0.0000 [IU] | Freq: Three times a day (TID) | INTRAVENOUS | Status: DC
  Filled 2019-05-17: qty 10

## 2019-05-17 MED ORDER — METOPROLOL TARTRATE 5 MG/5ML IV SOLN: 2.5000 mg | INTRAVENOUS | Status: DC | PRN

## 2019-05-17 MED ORDER — MORPHINE SULFATE (PF) 2 MG/ML IV SOLN: 1.0000 mg | INTRAVENOUS | Status: DC | PRN

## 2019-05-17 MED ORDER — FAMOTIDINE IN NACL 20-0.9 MG/50ML-% IV SOLN
20.0000 mg | Freq: Two times a day (BID) | INTRAVENOUS | Status: DC
  Administered 2019-05-17: 13:00:00 20 mg via INTRAVENOUS

## 2019-05-17 MED ORDER — VANCOMYCIN HCL IN DEXTROSE 1-5 GM/200ML-% IV SOLN
1000.0000 mg | Freq: Once | INTRAVENOUS | Status: AC
  Administered 2019-05-17: 1000 mg via INTRAVENOUS
  Filled 2019-05-17: qty 200

## 2019-05-17 MED ORDER — HEPARIN SODIUM (PORCINE) 1000 UNIT/ML IJ SOLN
INTRAMUSCULAR | Status: AC
  Filled 2019-05-17: qty 1

## 2019-05-17 MED ORDER — ASPIRIN EC 325 MG PO TBEC: 325.0000 mg | DELAYED_RELEASE_TABLET | Freq: Every day | ORAL | Status: DC

## 2019-05-17 MED ORDER — HEMOSTATIC AGENTS (NO CHARGE) OPTIME
TOPICAL | Status: DC | PRN
  Administered 2019-05-17 (×2): 1 via TOPICAL

## 2019-05-17 MED ORDER — ORAL CARE MOUTH RINSE: 15.0000 mL | OROMUCOSAL | Status: DC

## 2019-05-17 MED ORDER — MIDAZOLAM HCL 5 MG/5ML IJ SOLN
INTRAMUSCULAR | Status: DC | PRN
  Administered 2019-05-17: 4 mg via INTRAVENOUS
  Administered 2019-05-17: 3 mg via INTRAVENOUS
  Administered 2019-05-17 (×3): 1 mg via INTRAVENOUS

## 2019-05-17 MED ORDER — PROTAMINE SULFATE 10 MG/ML IV SOLN
INTRAVENOUS | Status: DC | PRN
  Administered 2019-05-17: 200 mg via INTRAVENOUS
  Administered 2019-05-17: 10 mg via INTRAVENOUS

## 2019-05-17 MED ORDER — MIDAZOLAM HCL (PF) 10 MG/2ML IJ SOLN
INTRAMUSCULAR | Status: AC
  Filled 2019-05-17: qty 2

## 2019-05-17 MED ORDER — ORAL CARE MOUTH RINSE
15.0000 mL | Freq: Two times a day (BID) | OROMUCOSAL | Status: DC
  Administered 2019-05-17 – 2019-05-20 (×6): 15 mL via OROMUCOSAL

## 2019-05-17 SURGICAL SUPPLY — 76 items
BAG DECANTER FOR FLEXI CONT (MISCELLANEOUS) ×3 IMPLANT
BLADE CLIPPER SURG (BLADE) ×3 IMPLANT
BLADE STERNUM SYSTEM 6 (BLADE) ×3 IMPLANT
BLADE SURG 11 STRL SS (BLADE) ×3 IMPLANT
BNDG ELASTIC 4X5.8 VLCR STR LF (GAUZE/BANDAGES/DRESSINGS) ×3 IMPLANT
BNDG ELASTIC 6X10 VLCR STRL LF (GAUZE/BANDAGES/DRESSINGS) ×3 IMPLANT
BNDG ELASTIC 6X5.8 VLCR STR LF (GAUZE/BANDAGES/DRESSINGS) ×3 IMPLANT
BNDG GAUZE ELAST 4 BULKY (GAUZE/BANDAGES/DRESSINGS) ×3 IMPLANT
CANISTER SUCT 3000ML PPV (MISCELLANEOUS) ×3 IMPLANT
CANNULA VEN 2 STAGE (MISCELLANEOUS) ×3 IMPLANT
CATH CPB KIT GERHARDT (MISCELLANEOUS) ×3 IMPLANT
CATH THORACIC 28FR (CATHETERS) ×3 IMPLANT
COVER PROBE W GEL 5X96 (DRAPES) ×3 IMPLANT
COVER WAND RF STERILE (DRAPES) IMPLANT
DERMABOND ADHESIVE PROPEN (GAUZE/BANDAGES/DRESSINGS) ×1
DERMABOND ADVANCED (GAUZE/BANDAGES/DRESSINGS) ×1
DERMABOND ADVANCED .7 DNX12 (GAUZE/BANDAGES/DRESSINGS) ×2 IMPLANT
DERMABOND ADVANCED .7 DNX6 (GAUZE/BANDAGES/DRESSINGS) ×2 IMPLANT
DRAIN CHANNEL 28F RND 3/8 FF (WOUND CARE) ×3 IMPLANT
DRAPE CARDIOVASCULAR INCISE (DRAPES) ×1
DRAPE SLUSH/WARMER DISC (DRAPES) ×3 IMPLANT
DRAPE SRG 135X102X78XABS (DRAPES) ×2 IMPLANT
DRSG AQUACEL AG ADV 3.5X14 (GAUZE/BANDAGES/DRESSINGS) ×3 IMPLANT
ELECT BLADE 4.0 EZ CLEAN MEGAD (MISCELLANEOUS) ×3
ELECT REM PT RETURN 9FT ADLT (ELECTROSURGICAL) ×6
ELECTRODE BLDE 4.0 EZ CLN MEGD (MISCELLANEOUS) ×2 IMPLANT
ELECTRODE REM PT RTRN 9FT ADLT (ELECTROSURGICAL) ×4 IMPLANT
FELT TEFLON 1X6 (MISCELLANEOUS) ×6 IMPLANT
GAUZE SPONGE 4X4 12PLY STRL (GAUZE/BANDAGES/DRESSINGS) ×9 IMPLANT
GLOVE BIO SURGEON STRL SZ 6.5 (GLOVE) ×30 IMPLANT
GOWN STRL REUS W/ TWL LRG LVL3 (GOWN DISPOSABLE) ×16 IMPLANT
GOWN STRL REUS W/TWL LRG LVL3 (GOWN DISPOSABLE) ×8
HEMOSTAT POWDER SURGIFOAM 1G (HEMOSTASIS) ×9 IMPLANT
HEMOSTAT SURGICEL 2X14 (HEMOSTASIS) ×3 IMPLANT
KIT BASIN OR (CUSTOM PROCEDURE TRAY) ×3 IMPLANT
KIT CATH SUCT 8FR (CATHETERS) ×3 IMPLANT
KIT SUCTION CATH 14FR (SUCTIONS) ×6 IMPLANT
KIT TURNOVER KIT B (KITS) ×3 IMPLANT
KIT VASOVIEW HEMOPRO 2 VH 4000 (KITS) ×3 IMPLANT
LEAD PACING MYOCARDI (MISCELLANEOUS) ×3 IMPLANT
MARKER GRAFT CORONARY BYPASS (MISCELLANEOUS) ×9 IMPLANT
NS IRRIG 1000ML POUR BTL (IV SOLUTION) ×18 IMPLANT
PACK E OPEN HEART (SUTURE) ×3 IMPLANT
PACK OPEN HEART (CUSTOM PROCEDURE TRAY) ×3 IMPLANT
PAD ARMBOARD 7.5X6 YLW CONV (MISCELLANEOUS) ×6 IMPLANT
PAD ELECT DEFIB RADIOL ZOLL (MISCELLANEOUS) ×3 IMPLANT
PENCIL BUTTON HOLSTER BLD 10FT (ELECTRODE) ×3 IMPLANT
POSITIONER HEAD DONUT 9IN (MISCELLANEOUS) ×3 IMPLANT
POWDER SURGICEL 3.0 GRAM (HEMOSTASIS) ×6 IMPLANT
PUNCH AORTIC ROT 4.0MM RCL 40 (MISCELLANEOUS) ×3 IMPLANT
SET CARDIOPLEGIA MPS 5001102 (MISCELLANEOUS) ×3 IMPLANT
SPONGE LAP 18X18 RF (DISPOSABLE) ×6 IMPLANT
SURGIFLO W/THROMBIN 8M KIT (HEMOSTASIS) ×3 IMPLANT
SUT BONE WAX W31G (SUTURE) ×3 IMPLANT
SUT MNCRL AB 4-0 PS2 18 (SUTURE) ×3 IMPLANT
SUT PROLENE 3 0 SH1 36 (SUTURE) ×3 IMPLANT
SUT PROLENE 4 0 TF (SUTURE) ×6 IMPLANT
SUT PROLENE 6 0 CC (SUTURE) ×6 IMPLANT
SUT PROLENE 7 0 BV1 MDA (SUTURE) ×6 IMPLANT
SUT PROLENE 8 0 BV175 6 (SUTURE) ×9 IMPLANT
SUT SILK  1 MH (SUTURE) ×2
SUT SILK 1 MH (SUTURE) ×4 IMPLANT
SUT STEEL 6MS V (SUTURE) ×3 IMPLANT
SUT STEEL SZ 6 DBL 3X14 BALL (SUTURE) ×3 IMPLANT
SUT VIC AB 1 CTX 18 (SUTURE) ×6 IMPLANT
SUT VIC AB 2-0 CT1 27 (SUTURE) ×1
SUT VIC AB 2-0 CT1 TAPERPNT 27 (SUTURE) ×2 IMPLANT
SYSTEM SAHARA CHEST DRAIN ATS (WOUND CARE) ×3 IMPLANT
TAPE CLOTH SURG 4X10 WHT LF (GAUZE/BANDAGES/DRESSINGS) ×6 IMPLANT
TAPE PAPER 2X10 WHT MICROPORE (GAUZE/BANDAGES/DRESSINGS) ×3 IMPLANT
TOWEL GREEN STERILE (TOWEL DISPOSABLE) ×3 IMPLANT
TOWEL GREEN STERILE FF (TOWEL DISPOSABLE) ×3 IMPLANT
TRAY FOLEY SLVR 16FR TEMP STAT (SET/KITS/TRAYS/PACK) ×3 IMPLANT
TUBING LAP HI FLOW INSUFFLATIO (TUBING) ×3 IMPLANT
UNDERPAD 30X30 (UNDERPADS AND DIAPERS) ×3 IMPLANT
WATER STERILE IRR 1000ML POUR (IV SOLUTION) ×6 IMPLANT

## 2019-05-17 NOTE — Progress Notes (Signed)
Rapid weaning protocol 

## 2019-05-17 NOTE — Progress Notes (Addendum)
[facility] BRIEF SICU PROGRESS NOTE  Day of Surgery  S/P Procedure(s) (LRB): CORONARY ARTERY BYPASS GRAFTING (CABG) x Three, using left internal mammary artery and right leg greater saphenous vein harvested endoscopically (N/A) TRANSESOPHAGEAL ECHOCARDIOGRAM (TEE) (N/A)   Extubated uneventfully NSR w/ stable hemodynamics on milrionone and Neo drips Breathing comfortably w/ O2 sats 97% Chest tube output low UOP adequate Labs okay  Plan: Continue routine early postop  Rexene Alberts, MD 05/17/2019 7:30 PM

## 2019-05-17 NOTE — Brief Op Note (Addendum)
      [address]       [place] [address]             [phone]     05/17/2019  11:08 AM  PATIENT:  Maria Zuniga  53 y.o. female  PRE-OPERATIVE DIAGNOSIS:  1. S/p STEMI 2.Coronary Artery Disease  POST-OPERATIVE DIAGNOSIS:  1. S/p STEMI 2.Coronary Artery Disease  PROCEDURE:  TRANSESOPHAGEAL ECHOCARDIOGRAM (TEE), MEDIAN STERNOTOMY for CORONARY ARTERY BYPASS GRAFTING (CABG) x 3 (LIMA to LAD, SVG to OM, SVG to RAMUS INTERMEDIATE) using left internal mammary artery and right leg greater saphenous vein harvested endoscopically   SURGEON:  Surgeon(s) and Role:    [name], MD - Primary  PHYSICIAN ASSISTANT: Lars Pinks PA-C  ANESTHESIA:   general  EBL:  Per anesthesia and perfusion record  DRAINS: Chest tubes placed in the mediastinal and pleural spaces   COUNTS CORRECT:  YES  DICTATION: .Dragon Dictation  PLAN OF CARE: Admit to inpatient   PATIENT DISPOSITION:  ICU - intubated and hemodynamically stable.   Delay start of Pharmacological VTE agent (>24hrs) due to surgical blood loss or risk of bleeding: yes  BASELINE WEIGHT: 60 kg

## 2019-05-17 NOTE — Transfer of Care (Signed)
Immediate Anesthesia Transfer of Care Note  Patient: Maria Zuniga  Procedure(s) Performed: CORONARY ARTERY BYPASS GRAFTING (CABG) x Three, using left internal mammary artery and right leg greater saphenous vein harvested endoscopically (N/A Chest) TRANSESOPHAGEAL ECHOCARDIOGRAM (TEE) (N/A )  Patient Location: ICU  Anesthesia Type:General  Level of Consciousness: Patient remains intubated per anesthesia plan  Airway & Oxygen Therapy: Patient remains intubated per anesthesia plan and Patient placed on Ventilator (see vital sign flow sheet for setting)  Post-op Assessment: Report given to RN and Post -op Vital signs reviewed and stable  Post vital signs: Reviewed and stable  Last Vitals:  Vitals Value Taken Time  BP 90/70   Temp 34.7 C 05/17/19 1311  Pulse 89 05/17/19 1311  Resp 16 05/17/19 1311  SpO2 99 % 05/17/19 1311  Vitals shown include unvalidated device data.  Last Pain:  Vitals:   05/17/19 0501  TempSrc:   PainSc: 0-No pain      Patients Stated Pain Goal: 0 (53/97/67 3419)  Complications: No apparent anesthesia complications

## 2019-05-17 NOTE — Progress Notes (Signed)
      [address]       [place] [address]             [phone]    Pre Procedure note for inpatients:   Maria Zuniga has been scheduled for Procedure(s): CORONARY ARTERY BYPASS GRAFTING (CABG) (N/A) TRANSESOPHAGEAL ECHOCARDIOGRAM (TEE) (N/A) today. The various methods of treatment have been discussed with the patient. After consideration of the risks, benefits and treatment options the patient has consented to the planned procedure.   The patient has been seen and labs reviewed. There are no changes in the patient's condition to prevent proceeding with the planned procedure today.  Recent labs:  Lab Results  Component Value Date   WBC 8.7 05/17/2019   HGB 13.7 05/17/2019   HCT 40.3 05/17/2019   PLT 351 05/17/2019   GLUCOSE 116 (H) 05/17/2019   CHOL 93 05/11/2019   TRIG 70 05/11/2019   HDL 25 (L) 05/11/2019   LDLCALC 54 05/11/2019   ALT 20 05/16/2019   AST 18 05/16/2019   NA 139 05/17/2019   K 4.6 05/17/2019   CL 105 05/17/2019   CREATININE 0.90 05/17/2019   BUN 18 05/17/2019   CO2 23 05/17/2019   TSH 0.941 05/11/2019   INR 1.1 05/11/2019   HGBA1C 6.2 (H) 05/11/2019    [name], MD 05/17/2019 7:04 AM

## 2019-05-17 NOTE — Progress Notes (Signed)
ETT advanced 2cm per MD to 21 at the lips. Vitals are stable. RT will continue to monitor.

## 2019-05-17 NOTE — Progress Notes (Signed)
RT NOTE: Patient still very sleepy. Ventilator alarming with low RR and VT. Patient placed back on original settings. RT will re-attempt rapid wean when patient more alert. Vitals are stable. RT will continue to monitor.

## 2019-05-17 NOTE — Anesthesia Procedure Notes (Signed)
Central Venous Catheter Insertion Performed by: Roberts Gaudy, MD, anesthesiologist Start/End10/04/2019 6:40 AM, 05/17/2019 6:50 AM Patient location: Pre-op. Preanesthetic checklist: patient identified, IV checked, site marked, risks and benefits discussed, surgical consent, monitors and equipment checked, pre-op evaluation, timeout performed and anesthesia consent Lidocaine 1% used for infiltration and patient sedated Hand hygiene performed  and maximum sterile barriers used  Catheter size: 9 Fr Sheath introducer Procedure performed using ultrasound guided technique. Ultrasound Notes:anatomy identified, needle tip was noted to be adjacent to the nerve/plexus identified, no ultrasound evidence of intravascular and/or intraneural injection and image(s) printed for medical record Attempts: 1 Following insertion, line sutured and dressing applied. Post procedure assessment: blood return through all ports, free fluid flow and no air  Patient tolerated the procedure well with no immediate complications.

## 2019-05-17 NOTE — Anesthesia Procedure Notes (Signed)
Arterial Line Insertion Start/End10/04/2019 6:45 AM, 05/17/2019 6:55 AM Performed by: CRNA  Patient location: Pre-op. Preanesthetic checklist: patient identified, IV checked, site marked, risks and benefits discussed, surgical consent, monitors and equipment checked, pre-op evaluation, timeout performed and anesthesia consent Lidocaine 1% used for infiltration radial was placed Catheter size: 20 Fr Hand hygiene performed  and maximum sterile barriers used   Attempts: 1 Procedure performed without using ultrasound guided technique. Following insertion, dressing applied and Biopatch. Post procedure assessment: normal and unchanged  Patient tolerated the procedure well with no immediate complications. Additional procedure comments: Performed by Reece Agar, SRNA under supervision of CRNA and MDA.

## 2019-05-17 NOTE — Progress Notes (Signed)
  Echocardiogram Echocardiogram Transesophageal has been performed.  Maria Zuniga 05/17/2019, 8:38 AM

## 2019-05-17 NOTE — Procedures (Signed)
Extubation Procedure Note  Patient Details:   Name: Maria Zuniga DOB: Jan 27, 1966 MRN: [id number]   Airway Documentation:    Vent end date: 05/17/19 Vent end time: 1824   Evaluation  O2 sats: stable throughout Complications: No apparent complications Patient did tolerate procedure well. Bilateral Breath Sounds: Clear, Diminished   Yes    Patient extubated per protocol to 4L [place] with no apparent complications. Positive cuff leak was noted prior to extubation. Patient achieved NIF of -30 and VC of .85L with good effort. Patient is alert and oriented and is able to speak. Vitals are stable. RT will continue to monitor.   Alejandro Adcox Clyda Greener 05/17/2019, 6:34 PM

## 2019-05-17 NOTE — Anesthesia Procedure Notes (Signed)
Central Venous Catheter Insertion Performed by: Roberts Gaudy, MD, anesthesiologist Start/End10/04/2019 6:40 AM, 05/17/2019 6:50 AM Patient location: Pre-op. Preanesthetic checklist: patient identified, IV checked, site marked, risks and benefits discussed, surgical consent, monitors and equipment checked, pre-op evaluation, timeout performed and anesthesia consent Position: supine Hand hygiene performed  and maximum sterile barriers used  PA cath was placed.Swan type:thermodilution Procedure performed without using ultrasound guided technique. Attempts: 1 Following insertion, line sutured, dressing applied and Biopatch. Post procedure assessment: blood return through all ports, free fluid flow and no air  Patient tolerated the procedure well with no immediate complications.

## 2019-05-17 NOTE — Anesthesia Procedure Notes (Signed)
Procedure Name: Intubation Date/Time: 05/17/2019 7:42 AM Performed by: Alain Marion, CRNA Pre-anesthesia Checklist: Patient identified, Emergency Drugs available, Suction available and Patient being monitored Patient Re-evaluated:Patient Re-evaluated prior to induction Oxygen Delivery Method: Circle System Utilized Preoxygenation: Pre-oxygenation with 100% oxygen Induction Type: IV induction Ventilation: Mask ventilation without difficulty and Oral airway inserted - appropriate to patient size Laryngoscope Size: Sabra Heck and 2 Grade View: Grade I Tube type: Oral Tube size: 8.0 mm Number of attempts: 1 Airway Equipment and Method: Stylet and Oral airway Placement Confirmation: ETT inserted through vocal cords under direct vision,  positive ETCO2 and breath sounds checked- equal and bilateral Secured at: 18 cm Tube secured with: Tape Dental Injury: Teeth and Oropharynx as per pre-operative assessment  Comments: Performed by SRNA under supervision of MDA and CRNA

## 2019-05-17 NOTE — Anesthesia Postprocedure Evaluation (Signed)
Anesthesia Post Note  Patient: Perris Dibiasio  Procedure(s) Performed: CORONARY ARTERY BYPASS GRAFTING (CABG) x Three, using left internal mammary artery and right leg greater saphenous vein harvested endoscopically (N/A Chest) TRANSESOPHAGEAL ECHOCARDIOGRAM (TEE) (N/A )     Patient location during evaluation: SICU Anesthesia Type: General Level of consciousness: sedated Pain management: pain level controlled Vital Signs Assessment: post-procedure vital signs reviewed and stable Respiratory status: patient remains intubated per anesthesia plan Cardiovascular status: stable Postop Assessment: no apparent nausea or vomiting Anesthetic complications: no    Last Vitals:  Vitals:   05/17/19 1303 05/17/19 1400  BP:    Pulse: 90 89  Resp: 12 14  Temp:  (!) 35.1 C  SpO2: 100% 100%    Last Pain:  Vitals:   05/17/19 0501  TempSrc:   PainSc: 0-No pain                 Kristal Perl P Darenda Fike

## 2019-05-18 ENCOUNTER — Inpatient Hospital Stay (HOSPITAL_COMMUNITY): Payer: 59

## 2019-05-18 LAB — BASIC METABOLIC PANEL
Anion gap: 9 (ref 5–15)
Anion gap: 7 (ref 5–15)
BUN: 16 mg/dL (ref 6–20)
BUN: 14 mg/dL (ref 6–20)
CO2: 20 mmol/L — ABNORMAL LOW (ref 22–32)
CO2: 24 mmol/L (ref 22–32)
Calcium: 8.1 mg/dL — ABNORMAL LOW (ref 8.9–10.3)
Calcium: 8.7 mg/dL — ABNORMAL LOW (ref 8.9–10.3)
Chloride: 106 mmol/L (ref 98–111)
Chloride: 102 mmol/L (ref 98–111)
Creatinine, Ser: 0.68 mg/dL (ref 0.44–1.00)
Creatinine, Ser: 0.58 mg/dL (ref 0.44–1.00)
GFR calc Af Amer: >60 mL/min (ref >60)
GFR calc Af Amer: >60 mL/min (ref >60)
GFR calc non Af Amer: >60 mL/min (ref >60)
GFR calc non Af Amer: >60 mL/min (ref >60)
Glucose, Bld: 139 mg/dL — ABNORMAL HIGH (ref 70–99)
Glucose, Bld: 125 mg/dL — ABNORMAL HIGH (ref 70–99)
Potassium: 4.3 mmol/L (ref 3.5–5.1)
Potassium: 3.9 mmol/L (ref 3.5–5.1)
Sodium: 135 mmol/L (ref 135–145)
Sodium: 133 mmol/L — ABNORMAL LOW (ref 135–145)

## 2019-05-18 LAB — CBC
HCT: 27.2 % — ABNORMAL LOW (ref 36.0–46.0)
HCT: 28.2 % — ABNORMAL LOW (ref 36.0–46.0)
Hemoglobin: 9.2 g/dL — ABNORMAL LOW (ref 12.0–15.0)
Hemoglobin: 9.4 g/dL — ABNORMAL LOW (ref 12.0–15.0)
MCH: 32.4 pg (ref 26.0–34.0)
MCH: 32 pg (ref 26.0–34.0)
MCHC: 33.8 g/dL (ref 30.0–36.0)
MCHC: 33.3 g/dL (ref 30.0–36.0)
MCV: 95.8 fL (ref 80.0–100.0)
MCV: 95.9 fL (ref 80.0–100.0)
Platelets: 205 10*3/uL (ref 150–400)
Platelets: 208 10*3/uL (ref 150–400)
RBC: 2.84 MIL/uL — ABNORMAL LOW (ref 3.87–5.11)
RBC: 2.94 MIL/uL — ABNORMAL LOW (ref 3.87–5.11)
RDW: 13.5 % (ref 11.5–15.5)
RDW: 13.7 % (ref 11.5–15.5)
WBC: 9.9 10*3/uL (ref 4.0–10.5)
WBC: 11.2 10*3/uL — ABNORMAL HIGH (ref 4.0–10.5)
nRBC: 0 % (ref 0.0–0.2)
nRBC: 0 % (ref 0.0–0.2)

## 2019-05-18 LAB — GLUCOSE, CAPILLARY
Glucose-Capillary: 141 mg/dL — ABNORMAL HIGH (ref 70–99)
Glucose-Capillary: 111 mg/dL — ABNORMAL HIGH (ref 70–99)
Glucose-Capillary: 139 mg/dL — ABNORMAL HIGH (ref 70–99)
Glucose-Capillary: 131 mg/dL — ABNORMAL HIGH (ref 70–99)
Glucose-Capillary: 158 mg/dL — ABNORMAL HIGH (ref 70–99)
Glucose-Capillary: 121 mg/dL — ABNORMAL HIGH (ref 70–99)
Glucose-Capillary: 132 mg/dL — ABNORMAL HIGH (ref 70–99)
Glucose-Capillary: 111 mg/dL — ABNORMAL HIGH (ref 70–99)

## 2019-05-18 LAB — COOXEMETRY PANEL
Carboxyhemoglobin: 0.6 % (ref 0.5–1.5)
Methemoglobin: 0.8 % (ref 0.0–1.5)
O2 Saturation: 60.2 %
Total hemoglobin: 9 g/dL — ABNORMAL LOW (ref 12.0–16.0)

## 2019-05-18 LAB — MAGNESIUM
Magnesium: 2.5 mg/dL — ABNORMAL HIGH (ref 1.7–2.4)
Magnesium: 2.2 mg/dL (ref 1.7–2.4)

## 2019-05-18 MED ORDER — CLOPIDOGREL BISULFATE 75 MG PO TABS
75.0000 mg | ORAL_TABLET | Freq: Every day | ORAL | Status: DC
  Administered 2019-05-18 – 2019-05-21 (×4): 75 mg via ORAL
  Filled 2019-05-18 (×4): qty 1

## 2019-05-18 MED ORDER — ASPIRIN EC 81 MG PO TBEC
81.0000 mg | DELAYED_RELEASE_TABLET | Freq: Every day | ORAL | Status: DC
  Administered 2019-05-18 – 2019-05-21 (×4): 81 mg via ORAL
  Filled 2019-05-18 (×4): qty 1

## 2019-05-18 MED ORDER — ENOXAPARIN SODIUM 30 MG/0.3ML ~~LOC~~ SOLN
30.0000 mg | Freq: Every day | SUBCUTANEOUS | Status: DC
  Administered 2019-05-19 – 2019-05-20 (×2): 30 mg via SUBCUTANEOUS
  Filled 2019-05-18 (×2): qty 0.3

## 2019-05-18 MED ORDER — FUROSEMIDE 10 MG/ML IJ SOLN
20.0000 mg | Freq: Four times a day (QID) | INTRAMUSCULAR | Status: AC
  Administered 2019-05-18 (×3): 20 mg via INTRAVENOUS
  Filled 2019-05-18 (×3): qty 2

## 2019-05-18 MED ORDER — INSULIN ASPART 100 UNIT/ML ~~LOC~~ SOLN
0.0000 [IU] | SUBCUTANEOUS | Status: DC
  Administered 2019-05-18 – 2019-05-19 (×3): 2 [IU] via SUBCUTANEOUS

## 2019-05-18 NOTE — Progress Notes (Addendum)
      [address]       [place] [address]             [phone]        CARDIOTHORACIC SURGERY PROGRESS NOTE   R1 Day Post-Op Procedure(s) (LRB): CORONARY ARTERY BYPASS GRAFTING (CABG) x Three, using left internal mammary artery and right leg greater saphenous vein harvested endoscopically (N/A) TRANSESOPHAGEAL ECHOCARDIOGRAM (TEE) (N/A)  Subjective: Looks good.  Expected soreness in chest  Objective: Vital signs: BP Readings from Last 1 Encounters:  05/18/19 (!) 134/55   Pulse Readings from Last 1 Encounters:  05/18/19 83   Resp Readings from Last 1 Encounters:  05/18/19 (!) 25   Temp Readings from Last 1 Encounters:  05/18/19 97.7 F (36.5 C)    Hemodynamics: PAP: (12-43)/(7-24) 34/15 CO:  [3.1 L/min-4.1 L/min] 4.1 L/min CI:  [1.9 L/min/m2-2.5 L/min/m2] 2.5 L/min/m2  Physical Exam:  Rhythm:   sinus  Breath sounds: clear  Heart sounds:  RRR  Incisions:  Dressings dry, intact  Abdomen:  Soft, non-distended, non-tender  Extremities:  Warm, well-perfused  Chest tubes:  low volume thin serosanguinous output, no air leak    Intake/Output from previous day: 10/09 0701 - 10/10 0700 In: 4104.5 [I.V.:2847.6; Blood:200; IV Piggyback:1056.9] Out: 2270 [Urine:1570; Blood:400; Chest Tube:300] Intake/Output this shift: No intake/output data recorded.  Lab Results:  CBC: Recent Labs    05/17/19 1831 05/17/19 1939 05/18/19 0354  WBC 11.2*  --  9.9  HGB 9.2* 9.2* 9.2*  HCT 27.3* 27.0* 27.2*  PLT 202  --  205    BMET:  Recent Labs    05/17/19 1831 05/17/19 1939 05/18/19 0354  NA 138 141 135  K 4.4 4.5 4.3  CL 110  --  106  CO2 21*  --  20*  GLUCOSE 122*  --  139*  BUN 16  --  16  CREATININE 0.79  --  0.68  CALCIUM 8.0*  --  8.1*     PT/INR:   Recent Labs    05/17/19 1317  LABPROT 15.6*  INR 1.3*    CBG (last 3)  Recent Labs    05/18/19 0010 05/18/19 0358 05/18/19 0810  GLUCAP 139* 141* 131*    ABG    Component  Value Date/Time   PHART 7.320 (L) 05/17/2019 1939   PCO2ART 39.3 05/17/2019 1939   PO2ART 103.0 05/17/2019 1939   HCO3 20.2 05/17/2019 1939   TCO2 21 (L) 05/17/2019 1939   ACIDBASEDEF 5.0 (H) 05/17/2019 1939   O2SAT 60.2 05/18/2019 0500    CXR: Bibasilar atelectasis  EKG: NSR w/out acute ischemic changes, previous anteroseptal infarct   Assessment/Plan: S/P Procedure(s) (LRB): CORONARY ARTERY BYPASS GRAFTING (CABG) x Three, using left internal mammary artery and right leg greater saphenous vein harvested endoscopically (N/A) TRANSESOPHAGEAL ECHOCARDIOGRAM (TEE) (N/A)  Doing well POD1 Maintaining NSR w/ stable BP on milrinone Breathing comfortably w/ O2 sats 92-97%, CXR looks good Expected post op acute blood loss anemia, mild Expected post op atelectasis Expected post op volume excess, weight reportedly 5 kg > preop, UOP adequate   Mobilize  D/C tubes and lines  Wean milrinone slowly  Diuresis  DAPT, beta blocker, statin  Consider adding ACE-I in 2-3 days depending on BP  Rexene Alberts, MD 05/18/2019 9:19 AM

## 2019-05-18 NOTE — Progress Notes (Signed)
[facility] BRIEF SICU PROGRESS NOTE  1 Day Post-Op  S/P Procedure(s) (LRB): CORONARY ARTERY BYPASS GRAFTING (CABG) x Three, using left internal mammary artery and right leg greater saphenous vein harvested endoscopically (N/A) TRANSESOPHAGEAL ECHOCARDIOGRAM (TEE) (N/A)   Stable day Maintaining NSR with stable BP Breathing comfortably w/ O2 sats 98% on 2 L/min UOP adequate  Plan: Continue routine care  Rexene Alberts, MD 05/18/2019 5:52 PM

## 2019-05-19 ENCOUNTER — Inpatient Hospital Stay (HOSPITAL_COMMUNITY): Payer: 59

## 2019-05-19 LAB — CBC
HCT: 25.3 % — ABNORMAL LOW (ref 36.0–46.0)
Hemoglobin: 8.4 g/dL — ABNORMAL LOW (ref 12.0–15.0)
MCH: 31.8 pg (ref 26.0–34.0)
MCHC: 33.2 g/dL (ref 30.0–36.0)
MCV: 95.8 fL (ref 80.0–100.0)
Platelets: 194 10*3/uL (ref 150–400)
RBC: 2.64 MIL/uL — ABNORMAL LOW (ref 3.87–5.11)
RDW: 13.6 % (ref 11.5–15.5)
WBC: 11 10*3/uL — ABNORMAL HIGH (ref 4.0–10.5)
nRBC: 0 % (ref 0.0–0.2)

## 2019-05-19 LAB — BASIC METABOLIC PANEL
Anion gap: 8 (ref 5–15)
BUN: 13 mg/dL (ref 6–20)
CO2: 27 mmol/L (ref 22–32)
Calcium: 8.5 mg/dL — ABNORMAL LOW (ref 8.9–10.3)
Chloride: 102 mmol/L (ref 98–111)
Creatinine, Ser: 0.64 mg/dL (ref 0.44–1.00)
GFR calc Af Amer: >60 mL/min (ref >60)
GFR calc non Af Amer: >60 mL/min (ref >60)
Glucose, Bld: 106 mg/dL — ABNORMAL HIGH (ref 70–99)
Potassium: 3.5 mmol/L (ref 3.5–5.1)
Sodium: 137 mmol/L (ref 135–145)

## 2019-05-19 LAB — GLUCOSE, CAPILLARY
Glucose-Capillary: 102 mg/dL — ABNORMAL HIGH (ref 70–99)
Glucose-Capillary: 130 mg/dL — ABNORMAL HIGH (ref 70–99)

## 2019-05-19 MED ORDER — POTASSIUM CHLORIDE 10 MEQ/50ML IV SOLN
10.0000 meq | INTRAVENOUS | Status: AC
  Administered 2019-05-19 (×5): 10 meq via INTRAVENOUS
  Filled 2019-05-19 (×5): qty 50

## 2019-05-19 MED ORDER — POTASSIUM CHLORIDE CRYS ER 20 MEQ PO TBCR
20.0000 meq | EXTENDED_RELEASE_TABLET | ORAL | Status: DC
  Administered 2019-05-19: 05:00:00 20 meq via ORAL
  Filled 2019-05-19: qty 1

## 2019-05-19 MED ORDER — MOVING RIGHT ALONG BOOK
Freq: Once | Status: DC
  Filled 2019-05-19: qty 1

## 2019-05-19 MED ORDER — LEVALBUTEROL HCL 0.63 MG/3ML IN NEBU: 0.6300 mg | INHALATION_SOLUTION | Freq: Four times a day (QID) | RESPIRATORY_TRACT | Status: DC | PRN

## 2019-05-19 MED ORDER — FUROSEMIDE 40 MG PO TABS
40.0000 mg | ORAL_TABLET | Freq: Every day | ORAL | Status: DC
  Administered 2019-05-19 – 2019-05-21 (×3): 40 mg via ORAL
  Filled 2019-05-19 (×3): qty 1

## 2019-05-19 MED ORDER — POTASSIUM CHLORIDE CRYS ER 20 MEQ PO TBCR
20.0000 meq | EXTENDED_RELEASE_TABLET | Freq: Every day | ORAL | Status: DC
  Administered 2019-05-20 – 2019-05-21 (×2): 20 meq via ORAL
  Filled 2019-05-19 (×2): qty 1

## 2019-05-19 MED ORDER — FE FUMARATE-B12-VIT C-FA-IFC PO CAPS
1.0000 | ORAL_CAPSULE | Freq: Every day | ORAL | Status: DC
  Administered 2019-05-20 – 2019-05-21 (×2): 1 via ORAL
  Filled 2019-05-19 (×2): qty 1

## 2019-05-19 NOTE — Plan of Care (Signed)
Pt is alert and oriented, now on 1L [place], breathing is even and unlabored, pt denies shortness of breath and pain. No signs of distress or discomfort noted. Pt has been stable throughout the night, no complaints voiced. Pt has responded well to IV lasix. Pt has had no drainage from incision or chest tube sites. No complications throughout the night nor when ambulating around the unit this morning. Pt is now in chair, resting comfortably. Call bell is within reach, will continue to monitor, Problem: Education: Goal: Knowledge of General Education information will improve Description: Including pain rating scale, medication(s)/side effects and non-pharmacologic comfort measures Outcome: Progressing   Problem: Clinical Measurements: Goal: Diagnostic test results will improve Outcome: Progressing Goal: Cardiovascular complication will be avoided Outcome: Progressing   Problem: Activity: Goal: Risk for activity intolerance will decrease Outcome: Progressing   Problem: Pain Managment: Goal: General experience of comfort will improve Outcome: Progressing   Problem: Activity: Goal: Ability to tolerate increased activity will improve Outcome: Progressing   Problem: Cardiac: Goal: Vascular access site(s) Level 0-1 will be maintained Outcome: Progressing   Problem: Activity: Goal: Risk for activity intolerance will decrease Outcome: Progressing

## 2019-05-19 NOTE — Progress Notes (Signed)
      [address]       [place] [address]             [phone]        CARDIOTHORACIC SURGERY PROGRESS NOTE   R2 Days Post-Op Procedure(s) (LRB): CORONARY ARTERY BYPASS GRAFTING (CABG) x Three, using left internal mammary artery and right leg greater saphenous vein harvested endoscopically (N/A) TRANSESOPHAGEAL ECHOCARDIOGRAM (TEE) (N/A)  Subjective: Feels better.  No complaints  Objective: Vital signs: BP Readings from Last 1 Encounters:  05/19/19 (!) 106/53   Pulse Readings from Last 1 Encounters:  05/19/19 92   Resp Readings from Last 1 Encounters:  05/19/19 (!) 22   Temp Readings from Last 1 Encounters:  05/19/19 98.3 F (36.8 C) (Oral)    Hemodynamics: PAP: (34)/(13) 34/13  Physical Exam:  Rhythm:   sinus  Breath sounds: clear  Heart sounds:  RRR  Incisions:  Dressings dry, intact  Abdomen:  Soft, non-distended, non-tender  Extremities:  Warm, well-perfused   Intake/Output from previous day: 10/10 0701 - 10/11 0700 In: 888.9 [P.O.:120; I.V.:535.8; IV Piggyback:233.1] Out: 3095 [Urine:3095] Intake/Output this shift: Total I/O In: 7.2 [I.V.:7.2] Out: 75 [Urine:75]  Lab Results:  CBC: Recent Labs    05/18/19 1643 05/19/19 0351  WBC 11.2* 11.0*  HGB 9.4* 8.4*  HCT 28.2* 25.3*  PLT 208 194    BMET:  Recent Labs    05/18/19 1643 05/19/19 0351  NA 133* 137  K 3.9 3.5  CL 102 102  CO2 24 27  GLUCOSE 125* 106*  BUN 14 13  CREATININE 0.58 0.64  CALCIUM 8.7* 8.5*     PT/INR:   Recent Labs    05/17/19 1317  LABPROT 15.6*  INR 1.3*    CBG (last 3)  Recent Labs    05/18/19 2312 05/19/19 0309 05/19/19 0750  GLUCAP 111* 102* 130*    ABG    Component Value Date/Time   PHART 7.320 (L) 05/17/2019 1939   PCO2ART 39.3 05/17/2019 1939   PO2ART 103.0 05/17/2019 1939   HCO3 20.2 05/17/2019 1939   TCO2 21 (L) 05/17/2019 1939   ACIDBASEDEF 5.0 (H) 05/17/2019 1939   O2SAT 60.2 05/18/2019 0500    CXR: PORTABLE  CHEST 1 VIEW  COMPARISON:  May 18, 2019.  FINDINGS: Stable cardiomediastinal silhouette. Left-sided chest tube is noted without pneumothorax. Swan-Ganz catheter has been removed. Right internal jugular venous sheath remains. Stable bibasilar subsegmental atelectasis is noted, right greater than left. Small right pleural effusion is noted. Bony thorax is unremarkable.  IMPRESSION: Stable bibasilar subsegmental atelectasis. Small right pleural effusion is noted. No pneumothorax is noted status post left-sided chest tube removal.   Electronically Signed   By: Marijo Conception M.D.   On: 05/19/2019 08:38  Assessment/Plan: S/P Procedure(s) (LRB): CORONARY ARTERY BYPASS GRAFTING (CABG) x Three, using left internal mammary artery and right leg greater saphenous vein harvested endoscopically (N/A) TRANSESOPHAGEAL ECHOCARDIOGRAM (TEE) (N/A)  Doing well POD2 Maintaining NSR w/ stable BP on low dose milrinone Breathing comfortably w/ O2 sats 93-97% on 1 L/min via , CXR looks good Expected post op acute blood loss anemia, mild Expected post op atelectasis Expected post op volume excess, diuresing well   Mobilize  Wean milrinone off  Diuresis  DAPT, beta blocker, statin  Consider adding ACE-I in 1-2 days depending on BP  Transfer 4E  Rexene Alberts, MD 05/19/2019 9:06 AM

## 2019-05-20 ENCOUNTER — Encounter (HOSPITAL_COMMUNITY): Payer: Self-pay | Admitting: Cardiothoracic Surgery

## 2019-05-20 ENCOUNTER — Inpatient Hospital Stay (HOSPITAL_COMMUNITY): Payer: 59

## 2019-05-20 DIAGNOSIS — Z951 Presence of aortocoronary bypass graft: Secondary | ICD-10-CM

## 2019-05-20 DIAGNOSIS — I5021 Acute systolic (congestive) heart failure: Secondary | ICD-10-CM

## 2019-05-20 DIAGNOSIS — I25118 Atherosclerotic heart disease of native coronary artery with other forms of angina pectoris: Secondary | ICD-10-CM

## 2019-05-20 LAB — BASIC METABOLIC PANEL
Anion gap: 9 (ref 5–15)
BUN: 12 mg/dL (ref 6–20)
CO2: 26 mmol/L (ref 22–32)
Calcium: 8.7 mg/dL — ABNORMAL LOW (ref 8.9–10.3)
Chloride: 102 mmol/L (ref 98–111)
Creatinine, Ser: 0.78 mg/dL (ref 0.44–1.00)
GFR calc Af Amer: >60 mL/min (ref >60)
GFR calc non Af Amer: >60 mL/min (ref >60)
Glucose, Bld: 130 mg/dL — ABNORMAL HIGH (ref 70–99)
Potassium: 3.8 mmol/L (ref 3.5–5.1)
Sodium: 137 mmol/L (ref 135–145)

## 2019-05-20 LAB — CBC
HCT: 26.8 % — ABNORMAL LOW (ref 36.0–46.0)
Hemoglobin: 8.7 g/dL — ABNORMAL LOW (ref 12.0–15.0)
MCH: 31.4 pg (ref 26.0–34.0)
MCHC: 32.5 g/dL (ref 30.0–36.0)
MCV: 96.8 fL (ref 80.0–100.0)
Platelets: 289 10*3/uL (ref 150–400)
RBC: 2.77 MIL/uL — ABNORMAL LOW (ref 3.87–5.11)
RDW: 13.7 % (ref 11.5–15.5)
WBC: 10.8 10*3/uL — ABNORMAL HIGH (ref 4.0–10.5)
nRBC: 0 % (ref 0.0–0.2)

## 2019-05-20 MED ORDER — POTASSIUM CHLORIDE CRYS ER 20 MEQ PO TBCR: 20.0000 meq | EXTENDED_RELEASE_TABLET | Freq: Every day | ORAL | Status: DC

## 2019-05-20 NOTE — Progress Notes (Signed)
Pt educated ref to EPW removal and 1 hr bedrest. Pt agrees. Vital stable and monitored q 15 for 1 hr. Pt tolerated procedure well. All incisions painted with Betadine. PT denies complaints. Will continue to monitor.  Jerald Kief, RN

## 2019-05-20 NOTE — Progress Notes (Signed)
CARDIAC REHAB PHASE I   PRE:  Rate/Rhythm: 73 SR    BP: sitting 117/59    SaO2: 90-91 RA  MODE:  Ambulation: 790 ft   POST:  Rate/Rhythm: 107 ST    BP: sitting 13/58     SaO2: 93 RA  Pt doing well. Able to move independently, used RW in hall. Steady, no c/o. SaO2 increased with ambulation. To recliner. Encouraged IS and x1-2 more walks today. ? If pt needs RW for d/c. Will discuss more tomorrow. [place], ACSM 05/20/2019 9:54 AM

## 2019-05-20 NOTE — Op Note (Signed)
Maria Zuniga, CAPONIGRO MEDICAL RECORD IR:[id number] ACCOUNT [id number] DATE OF BIRTH:04-15-1966 FACILITY: [facility] LOCATION: [id number] PHYSICIAN:Raisa Ditto BServando Snare, MD  OPERATIVE REPORT  DATE OF PROCEDURE:  05/17/2019  PREOPERATIVE DIAGNOSES:  Coronary occlusive disease with recent anterior apical transmural myocardial infarction with high-grade left anterior descending and left main disease.  POSTOPERATIVE DIAGNOSES:  Coronary occlusive disease with recent anterior apical transmural myocardial infarction with high-grade left anterior descending and left main disease.  SURGICAL PROCEDURE:  Coronary artery bypass grafting x3 with the left internal mammary to the left anterior descending coronary artery, reverse saphenous vein graft to the intermediate coronary artery, reverse saphenous vein graft to the circumflex  coronary artery with right thigh endoscopic vein harvesting of the right thigh greater saphenous vein.  SURGEON:  Lanelle Bal, MD  FIRST ASSISTANT:  Lars Pinks, PA-C  BRIEF HISTORY:  The patient is a 53 year old female who approximately 2 weeks prior to admission was having stuttering intermittent chest pain.  This continued to increase in severity and duration.  On August 3 she had a prolonged episode of chest pain  and came to the emergency room.  She was evaluated by cardiology and ultimately underwent urgent cardiac catheterization.  Her troponins were greater than 27,000.  At the time of catheterization, she was noted to have a high-degree stenosis of the  proximal LAD at the very ostium.  In addition, she had 70% left main and ostium.  The right coronary artery was relatively normal with areas of 35% stenosis.  She stabilized medically.  Echocardiogram confirmed anterior apical hypokinesis with overall  25% ejection fraction.  There was some suggestion of anterior apical thrombus.  However, on repeat echocardiogram and TEE in the operating room, this did not appear to be  the case.  Because of the complex nature of the disease, acute angioplasty was not  done, and with what appeared to be at our hospital, myocardial infarction with stuttering pain for at least several days, if not longer.  The patient was stabilized medically on IV heparin.  Coronary artery bypass grafting was recommended to her.  Risks  and options were discussed, and she was willing to proceed.  DESCRIPTION OF PROCEDURE:  The patient underwent general endotracheal anesthesia without incident.  Swan-Ganz and arterial line monitors had been placed.  A TEE probe was placed by anesthesia.  Again, this showed anterior apical hypokinesis without  significant valvular disease.  It was noted that there was no intraventricular clot appreciated.  The skin of chest and legs was prepped with Betadine, draped in the usual sterile manner.  Appropriate timeout was performed.  A small incision was made in  the left knee area, but no suitable vein could be located.  A similar incision was made just below the right knee, and a good quality vein was identified and endoscopically harvested from the right thigh.  A median sternotomy was performed.  Left  internal mammary artery was dissected down as a pedicle graft.  The distal artery was divided and had good free flow.  Pericardium was opened.  There was moderate pericardial effusion, but it was not bloody.  Aorta was normal size.  The patient was  systemically heparinized.  The ascending aorta was cannulated.  The right atrium was cannulated with a dual-stage venous cannula.  The patient was placed on cardiopulmonary bypass 2.4 L/min per sq m.  The left anterior descending coronary artery was  dissected out of its partially intramyocardial position between the mid and distal third.  The heart  was elevated and the intermediate coronary artery was partially intramyocardial.  This vessel was opened.  This vessel was dissected out, as was the  distal circumflex vessel.  The  patient's body temperature was cooled to 32 degrees.  Aortic crossclamp was applied; 500 mL of cold blood potassium cardioplegia was administered with diastolic arrest of the heart.  Myocardial septal temperatures were  monitored throughout the crossclamp.  We turned our attention first to the distal circumflex vessel.  This vessel was opened and admitted a 1.5 mm probe.  Using a running 7-0 Prolene, distal anastomosis was performed.  We then turned our attention to the  intermediate coronary artery.  This vessel was slightly smaller, thinner walled, and distal anastomosis was performed with a segment of reverse saphenous vein graft with a running 8-0 Prolene.  Then, additional cold blood cardioplegia was administered  down the vein grafts and into the aortic root.  We then turned our attention to the left anterior descending coronary artery, which was opened and admitted a 1 mm probe distally.  The vessel was approximately 1.2 mm in size.  Using a running 8-0 Prolene,  the left internal mammary artery was anastomosed to the left anterior descending coronary artery.  It was noted on the echo and visually this distal anterior wall and apex appeared hypokinetic.  With crossclamp still in place, 2 punch aortotomies were  performed, and each of the 2 vein grafts were anastomosed to the ascending aorta.  The bulldog was removed from the mammary artery with a rise in myocardial septal temperature.  The heart was allowed to passively fill and deair.  The proximal anastomoses  were completed.  Aortic crossclamp was removed with total crossclamp time of 80 minutes.  The patient spontaneously converted to a sinus rhythm.  Atrial and ventricular pacing wires were applied.  The patient had been started on a milrinone infusion and  low-dose dopamine because of tachycardia.  The dopamine was stopped.  With the patient's body temperature rewarmed to 37 degrees, she was then ventilated and weaned from cardiopulmonary bypass  without difficulty.  She remained hemodynamically stable on  low-dose milrinone.  TEE showed reasonable filling without significant mitral regurgitation.  The anterior apical wall appeared as it did at the beginning of the case.  With the patient hemodynamically stable, she was decannulated in the usual fashion.   Protamine sulfate was administered.  Graft markers were applied.  A left pleural tube and Blake mediastinal drain were left in place.  Pericardium was loosely reapproximated.  With the operative field hemostatic, we then closed the sternum with #6  stainless steel wire.  Fascia closed with interrupted 0 Vicryl, running 3-0 Vicryl in subcutaneous tissue, 4-0 subcuticular stitches in the skin edges.  Dry dressings were applied.  Sponge and needle count was reported as correct at completion of the  procedure.  The patient tolerated the procedure without obvious complication and was transferred to the surgical intensive care unit for further postop care.  She did not require any blood bank blood products during the operative procedure.  Total pump  time was 104 minutes.  RF scanning reported clear code.  LN/NUANCE  D:05/19/2019 T:05/20/2019 JOB:[id number]

## 2019-05-20 NOTE — Progress Notes (Addendum)
Progress Note  Patient Name: Maria Zuniga Date of Encounter: 05/20/2019  Primary Cardiologist: Peter Swaziland, MD   Subjective   Patient is doing well this morning. Patient is ambulating without symptoms. No chest pain or sob.   Inpatient Medications    Scheduled Meds:  acetaminophen  1,000 mg Oral Q6H   aspirin EC  81 mg Oral Daily   atorvastatin  80 mg Oral q1800   bisacodyl  10 mg Oral Daily   Or   bisacodyl  10 mg Rectal Daily   Chlorhexidine Gluconate Cloth  6 each Topical Daily   clopidogrel  75 mg Oral Daily   docusate sodium  200 mg Oral Daily   enoxaparin (LOVENOX) injection  30 mg Subcutaneous QHS   ezetimibe  10 mg Oral Daily   ferrous fumarate-b12-vitamic C-folic acid  1 capsule Oral Q breakfast   furosemide  40 mg Oral Daily   levothyroxine  100 mcg Oral QAC breakfast   mouth rinse  15 mL Mouth Rinse BID   metoprolol tartrate  12.5 mg Oral BID   moving right along book   Does not apply Once   pantoprazole  40 mg Oral Daily   potassium chloride  20 mEq Oral Daily   sodium chloride flush  10-40 mL Intracatheter Q12H   sodium chloride flush  3 mL Intravenous Q12H   Continuous Infusions:  PRN Meds: levalbuterol, metoprolol tartrate, ondansetron (ZOFRAN) IV, oxyCODONE, sodium chloride flush, sodium chloride flush, traMADol   Vital Signs    Vitals:   05/19/19 2217 05/20/19 0037 05/20/19 0440 05/20/19 0505  BP: (!) 121/58 121/61 126/68   Pulse: 91 87 78   Resp: 19 20 15    Temp: 99 F (37.2 C) 99.3 F (37.4 C) 98.5 F (36.9 C)   TempSrc: Oral Oral Oral   SpO2: 93% 96% 99%   Weight:    65.1 kg  Height:        Intake/Output Summary (Last 24 hours) at 05/20/2019 0814 Last data filed at 05/20/2019 0505 Gross per 24 hour  Intake 504.59 ml  Output 2280 ml  Net -1775.41 ml   Last 3 Weights 05/20/2019 05/19/2019 05/18/2019  Weight (lbs) 143 lb 8.3 oz 147 lb 4.3 oz 149 lb 7.6 oz  Weight (kg) 65.1 kg 66.8 kg 67.8 kg       Telemetry    NSR, HR 80-90s, no other arrhythmias noted - Personally Reviewed  ECG    No new - Personally Reviewed  Physical Exam   GEN: No acute distress.   Neck: No JVD Cardiac: RRR, no murmurs, rubs, or gallops.  Respiratory: Clear to auscultation bilaterally. GI: Soft, nontender, non-distended  MS: No edema; No deformity. Neuro:  Nonfocal  Psych: Normal affect   Labs    High Sensitivity Troponin:   Recent Labs  Lab 05/11/19 1323 05/11/19 1337  TROPONINIHS >27,000* >27,000*      Chemistry Recent Labs  Lab 05/16/19 0310  05/18/19 1643 05/19/19 0351 05/20/19 0535  NA 138   < > 133* 137 137  K 4.1   < > 3.9 3.5 3.8  CL 104   < > 102 102 102  CO2 22   < > 24 27 26   GLUCOSE 106*   < > 125* 106* 130*  BUN 19   < > 14 13 12   CREATININE 0.96   < > 0.58 0.64 0.78  CALCIUM 9.3   < > 8.7* 8.5* 8.7*  PROT 6.5  --   --   --   --  ALBUMIN 3.4*  --   --   --   --   AST 18  --   --   --   --   ALT 20  --   --   --   --   ALKPHOS 54  --   --   --   --   BILITOT 0.7  --   --   --   --   GFRNONAA >60   < > >60 >60 >60  GFRAA >60   < > >60 >60 >60  ANIONGAP 12   < > 7 8 9    < > = values in this interval not displayed.     Hematology Recent Labs  Lab 05/18/19 1643 05/19/19 0351 05/20/19 0535  WBC 11.2* 11.0* 10.8*  RBC 2.94* 2.64* 2.77*  HGB 9.4* 8.4* 8.7*  HCT 28.2* 25.3* 26.8*  MCV 95.9 95.8 96.8  MCH 32.0 31.8 31.4  MCHC 33.3 33.2 32.5  RDW 13.7 13.6 13.7  PLT 208 194 289    BNPNo results for input(s): BNP, PROBNP in the last 168 hours.   DDimer No results for input(s): DDIMER in the last 168 hours.   Radiology    Dg Chest 2 View  Result Date: 05/20/2019 CLINICAL DATA:  53 year old female with chest pain status post cardiac surgery EXAM: CHEST - 2 VIEW COMPARISON:  05/19/2019, 05/18/2011 FINDINGS: Cardiomediastinal silhouette unchanged in size and contour. Surgical changes of median sternotomy and CABG. Interval removal of right IJ sheath.  Epicardial pacing leads remain. Improving aeration of the lungs with persisting blunting of the right costophrenic angle and asymmetric elevation of the right hemidiaphragm. Coarsened interstitial markings with no confluent airspace disease. No visualized pneumothorax. IMPRESSION: Improving lung aeration with trace right-sided pleural fluid and/or atelectasis. Surgical changes of median sternotomy and CABG. Interval removal of right IJ sheath, with epicardial pacing leads remaining Electronically Signed   By: Gilmer MorJaime  Wagner D.O.   On: 05/20/2019 07:57   Dg Chest Port 1 View  Result Date: 05/19/2019 CLINICAL DATA:  Status post coronary bypass graft. EXAM: PORTABLE CHEST 1 VIEW COMPARISON:  May 18, 2019. FINDINGS: Stable cardiomediastinal silhouette. Left-sided chest tube is noted without pneumothorax. Swan-Ganz catheter has been removed. Right internal jugular venous sheath remains. Stable bibasilar subsegmental atelectasis is noted, right greater than left. Small right pleural effusion is noted. Bony thorax is unremarkable. IMPRESSION: Stable bibasilar subsegmental atelectasis. Small right pleural effusion is noted. No pneumothorax is noted status post left-sided chest tube removal. Electronically Signed   By: Lupita RaiderJames  Green Jr M.D.   On: 05/19/2019 08:38    Cardiac Studies   Echo 05/16/19  1. Severe LV dysfunction with EF 25-30% with swirling of definity contrast in apex. Study suggestive of possible early forming thrombus in LV apex in some views.  FINDINGS  Left Ventricle: Left ventricular ejection fraction, by visual estimation, is 60 to 65%. The left ventricle has normal function. There is no left ventricular hypertrophy. Normal left ventricular size. Severe LV dysfunction with EF 25-30% with swirling of  definity contrast in apex. Study suggestive of possible early forming thrombus in LV apex in some views.     LHC 05/12/19:  Ost LM to Dist LM lesion is 75% stenosed.  Ost LAD lesion is  95% stenosed.  Ost Cx lesion is 60% stenosed.  Prox RCA lesion is 35% stenosed.  There is moderate left ventricular systolic dysfunction.  LV end diastolic pressure is moderately elevated.  The left ventricular ejection fraction  is 35-45% by visual estimate.  1. Severe left main and critical ostial LAD stenosis. Severe dampening of pressures with any catheter engagement.  2. Moderately elevated LVDEP.  3. Moderate LV dysfunction. EF estimated at 35-40% with anteroapical wall motion abnormality.  Echocardiogram on May 12, 2019 IMPRESSIONS  1. Left ventricular ejection fraction, by visual estimation, is 35%. The left ventricle has moderately decreased function. Normal left ventricular size. There is mildly increased left ventricular hypertrophy. 2. Multiple segmental abnormalities exist. See findings. 3. Definity contrast agent was given IV to delineate the left ventricular endocardial borders. There is loosely organized thrombus at the apex. 4. Global right ventricle has normal systolic function.The right ventricular size is normal. No increase in right ventricular wall thickness. 5. Left atrial size was normal. 6. Right atrial size was normal. 7. The mitral valve is grossly normal. Trace mitral valve regurgitation. 8. The tricuspid valve is grossly normal. Tricuspid valve regurgitation is trivial. 9. The pulmonic valve was grossly normal. Pulmonic valve regurgitation is not visualized by color flow Doppler. 10. The inferior vena cava is normal in size with greater than 50% respiratory variability, suggesting right atrial pressure of 3 mmHg. 11. The aortic valve was not well visualized Aortic valve regurgitation was not visualized by color flow Doppler. 12. TR signal is inadequate for assessing pulmonary artery systolic pressure.  Patient Profile     53 y.o. female with HTN, Hyperlipidemia, prior tobacco use and family history of CAD here with subacute STEMI treated  with CABG.   Assessment & Plan    Subacute STEMI Patient presented with over 2 weeks of anginal symptoms 05/11/19. EKG showed dynamic ST changes.  - Cath showed 75% ostial, 60% LC, and 35% RCA - Echo showed EF 35% and concern for apical thrombus in the the left ventricle. Patient was placed on heparin with plan for repeat echo and CABG. Repeat echo showed swirling of definity contrast in apex suggestive early forming thrombus in LV apex.  - Patient underwent CABG and is now post op day 3. - Patient weaned from milrinone over the weekend>> antihypertensives were held - Diuresing with Lasix 40 mg 40 with potassium 20 mEq daily>>euvolemic on exam.  - Continued plavix and ASA - Patient was started on Lopressor 12.5 mg. Pressure 126/88 today. Plan to add ACE/ARB if BPs continue to be stable - Continue cardiac rehab>>patient is ambulating well without symptoms  Hyperlipidemia - LDL 54 - Continue atorvastatin, Zetia  Hypertension - BP stable - Continue carvedilol  Tobacco abuse - Quit one year ago  For questions or updates, please contact [facility] Please consult [email or web address] for contact info under        Signed, Cadence Ninfa Meeker, PA-C  05/20/2019, 8:14 AM    I have examined the patient and reviewed assessment and plan and discussed with patient.  Agree with above as stated.    Appears euvolemic.  She did well walking.  Beta blocker for now.  Add ACE-I as BP allows.   Continue to avoid tobacco.  Larae Grooms

## 2019-05-20 NOTE — Discharge Summary (Signed)
Physician Discharge Summary  Patient ID: Maria Zuniga MRN: [id number] DOB/AGE: 55-Feb-1967 55 y.o.  Admit date: 05/11/2019 Discharge date: 05/21/2019  Admission Diagnoses:  Patient Active Problem List   Diagnosis Date Noted  . Coronary artery disease 05/17/2019  . Coronary artery disease involving native heart without angina pectoris   . STEMI involving left anterior descending coronary artery ([place]) 05/11/2019  . Hyperlipidemia 05/11/2019  . HTN (hypertension) 05/11/2019  . Hypothyroidism 05/11/2019   Discharge Diagnoses:   Patient Active Problem List   Diagnosis Date Noted  . S/P CABG x 3 05/17/2019  . Coronary artery disease 05/17/2019  . Coronary artery disease involving native heart without angina pectoris   . STEMI involving left anterior descending coronary artery ([place]) 05/11/2019  . Hyperlipidemia 05/11/2019  . HTN (hypertension) 05/11/2019  . Hypothyroidism 05/11/2019   Discharged Condition: good  History of Present Illness:   Maria Zuniga is a 53 yo white female with known history of HTN and Hyperlipidemia.  Over the past 2 weeks the patient has been experiencing chest discomfort and shoulder pain intermittently.  She noted she was experiencing chest discomfort, shoulder pain, and left arm pain yesterday which resolved.  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  She presented to urgent care for evaluation where she was given ASA and NTG with resolution of her symptoms.  She was sent to [facility] for [facility] evaluation.  ED workup showed troponin level to be positive, EKG showed ST elevation. She was ruled in for MI and admitted for further care.     Hospital Course:   She remained chest pain free during hospitalization.  She was taken to the catheterization lab and was found to have multivessel CAD with a preserved EF.  It was felt coronary bypass grafting would be indicated and TCTS consult was obtained.  The patient was taken to  the operating room and underwent CABG x 3 utilizing LIMA to LAD, SVG to Ramus Intermediate, and SVG to Left Circumflex artery.  She also underwent endoscopic harvest of greater saphenous vein from right thigh.  She tolerated the procedure without difficulty and was taken to the SICU in stable condition.  She was extubated the evening of surgery.  During her stay in the SICU she was weaned Milrinone and Neo-synephrine drips. Her chest tubes and arterial lines were removed without difficulty.  She was treated with Lasix for mild volume overload status.  She was maintaining NSR and was transferred to the progressive care unit on 05/19/2019.  She continued to make progress.  She remains in NSR.  Her pacing wires have been removed without difficulty.  She is ambulating independently.  Her incisions are healing without evidence of infection.  She is medically stable for discharge home today.  Significant Diagnostic Studies: angiography:    Ost LM to Dist LM lesion is 75% stenosed.  Ost LAD lesion is 95% stenosed.  Ost Cx lesion is 60% stenosed.  Prox RCA lesion is 35% stenosed.  There is moderate left ventricular systolic dysfunction.  LV end diastolic pressure is moderately elevated.  The left ventricular ejection fraction is 35-45% by visual estimate.  Treatments: surgery:   Coronary artery bypass grafting x3 with the left internal mammary to the left anterior descending coronary artery, reverse saphenous vein graft to the intermediate coronary artery, reverse saphenous vein graft to the circumflex coronary artery with right thigh endoscopic vein harvesting of the right thigh greater saphenous vein.  Discharge Exam: Blood  pressure 123/61, pulse 90, temperature 98.1 F (36.7 C), temperature source Oral, resp. rate 20, height 5\' 3"  (1.6 m), weight 64 kg, SpO2 98 %.  General appearance: alert, cooperative and no distress Heart: regular rate and rhythm Lungs: clear to auscultation  bilaterally Abdomen: soft, non-tender; bowel sounds normal; no masses,  no organomegaly Extremities: edema trace Wound: clean and dry  Discharge Medications:  The patient has been discharged on:   1.Beta Blocker:  Yes [ x  ]                              No   [   ]                              If No, reason:  2.Ace Inhibitor/ARB: Yes [   ]                                     No  [ x   ]                                     If No, reason: labile BP 3.Statin:   Yes [ x  ]                  No  [   ]                  If No, reason:  4.Ecasa:  Yes  [ x  ]                  No   [   ]                  If No, reason:     Allergies as of 05/21/2019   No Known Allergies     Medication List    TAKE these medications   acetaminophen 500 MG tablet Commonly known as: TYLENOL Take 2 tablets (1,000 mg total) by mouth every 6 (six) hours as needed for mild pain or fever.   aspirin 81 MG EC tablet Take 1 tablet (81 mg total) by mouth daily.   atorvastatin 80 MG tablet Commonly known as: LIPITOR Take 1 tablet (80 mg total) by mouth daily at 6 PM. What changed:   medication strength  how much to take  when to take this   clopidogrel 75 MG tablet Commonly known as: PLAVIX Take 1 tablet (75 mg total) by mouth daily.   ezetimibe 10 MG tablet Commonly known as: ZETIA Take 10 mg by mouth daily.   fenofibrate 54 MG tablet Take 54 mg by mouth daily.   furosemide 20 MG tablet Commonly known as: LASIX Take 20 mg by mouth daily as needed for fluid or edema.   levothyroxine 100 MCG tablet Commonly known as: SYNTHROID Take 100 mcg by mouth daily before breakfast.   metoprolol tartrate 25 MG tablet Commonly known as: LOPRESSOR Take 1 tablet (25 mg total) by mouth 2 (two) times daily.   oxyCODONE 5 MG immediate release tablet Commonly known as: Oxy IR/ROXICODONE Take 1-2 tablets (5-10 mg total) by mouth every 4 (four) hours as needed for severe pain.   potassium chloride  SA 20 MEQ tablet Commonly known as: KLOR-CON Take 1 tablet (20 mEq total) by mouth daily. On days you take Lasix      Follow-up Information    Azalee CourseMeng, Hao, [place] Follow up on 05/28/2019.   Specialties: Cardiology, Radiology Why: Please go to hospital follow up October 20th at 1:30 PM Contact information: [address] [place] [address] [phone]        Delight OvensGerhardt, Edward B, MD Follow up on 06/13/2019.   Specialty: Cardiothoracic Surgery Why: Appointment is at 11:30, [facility] located on first floor of our office building Contact information: [address] [place] [address] [phone]           Signed:  Lowella Dandyrin Barrett PA-C 05/21/2019, 8:16 AM

## 2019-05-20 NOTE — Plan of Care (Signed)
Poc progressing.  

## 2019-05-20 NOTE — Progress Notes (Addendum)
      [address]       [place] [address]             [phone]        3 Days Post-Op Procedure(s) (LRB): CORONARY ARTERY BYPASS GRAFTING (CABG) x Three, using left internal mammary artery and right leg greater saphenous vein harvested endoscopically (N/A) TRANSESOPHAGEAL ECHOCARDIOGRAM (TEE) (N/A)  Subjective: Patient without specific complaints this am.  Objective: Vital signs in last 24 hours: Temp:  [98.5 F (36.9 C)-100.2 F (37.9 C)] 98.5 F (36.9 C) (10/12 0440) Pulse Rate:  [78-95] 78 (10/12 0440) Cardiac Rhythm: Normal sinus rhythm (10/12 0440) Resp:  [15-27] 15 (10/12 0440) BP: (93-129)/(52-71) 126/68 (10/12 0440) SpO2:  [88 %-99 %] 99 % (10/12 0440) Weight:  [65.1 kg] 65.1 kg (10/12 0505)  Pre op weight 60 kg Current Weight  05/20/19 65.1 kg       Intake/Output from previous day: 10/11 0701 - 10/12 0700 In: 511.8 [P.O.:240; I.V.:21.6; IV Piggyback:250.2] Out: 2355 [Urine:2355]   Physical Exam:  Cardiovascular: RRR Pulmonary: Slightly diminished at bases R>L Abdomen: Soft, non tender, bowel sounds present. Extremities: Mild bilateral lower extremity edema. Wounds: Aquacel removed is wound clean and dry.  No erythema or signs of infection. RLE and LLE wounds are clean and dry. Ecchymosis right thigh.  Lab Results: CBC: Recent Labs    05/19/19 0351 05/20/19 0535  WBC 11.0* 10.8*  HGB 8.4* 8.7*  HCT 25.3* 26.8*  PLT 194 289   BMET:  Recent Labs    05/19/19 0351 05/20/19 0535  NA 137 137  K 3.5 3.8  CL 102 102  CO2 27 26  GLUCOSE 106* 130*  BUN 13 12  CREATININE 0.64 0.78  CALCIUM 8.5* 8.7*    PT/INR:  Lab Results  Component Value Date   INR 1.3 (H) 05/17/2019   INR 1.1 05/11/2019   ABG:  INR: Will add last result for INR, ABG once components are confirmed Will add last 4 CBG results once components are confirmed  Assessment/Plan:  1. CV - S/p STEMI. SR in the 80's. On Lopressor 12.5 mg bid. Hope to  start low dose ACE/ARB soon. 2.  Pulmonary - On room air. CXR this am shows small right pleural effusion and atelectasis, elevation right hemi daphragm. Encourage incentive spirometer. 3. Volume Overload -On Lasix 40 mg daily 4.  Acute blood loss anemia - H and H this am stable at 8.7 and 26.8. Continue Trinsicon 5. Supplement potassium 6. Remove EPW 7. Pre op HGA1C 6.2. She likely has pre diabetes. She will need follow up with her medical doctor after discharge and she will be provided nutritional recommendations  Sharalyn Ink ZimmermanPA-C 05/20/2019,8:27 AM [phone]  Poss home in am. I have seen and examined Maria Zuniga and agree with the above assessment  and plan.  [name] MD Beeper [phone] Office [phone] 05/20/2019 3:17 PM

## 2019-05-21 MED ORDER — ASPIRIN 81 MG PO TBEC: 81.0000 mg | DELAYED_RELEASE_TABLET | Freq: Every day | ORAL | Status: DC

## 2019-05-21 MED ORDER — POTASSIUM CHLORIDE CRYS ER 20 MEQ PO TBCR: 20.0000 meq | EXTENDED_RELEASE_TABLET | Freq: Every day | ORAL | 0 refills | Status: DC

## 2019-05-21 MED ORDER — ACETAMINOPHEN 500 MG PO TABS: 1000.0000 mg | ORAL_TABLET | Freq: Four times a day (QID) | ORAL | 0 refills | Status: DC | PRN

## 2019-05-21 MED ORDER — METOPROLOL TARTRATE 25 MG PO TABS
25.0000 mg | ORAL_TABLET | Freq: Two times a day (BID) | ORAL | Status: DC
  Administered 2019-05-21: 09:00:00 25 mg via ORAL
  Filled 2019-05-21: qty 1

## 2019-05-21 MED ORDER — METOPROLOL TARTRATE 25 MG PO TABS: 25.0000 mg | ORAL_TABLET | Freq: Two times a day (BID) | ORAL | 3 refills | Status: DC

## 2019-05-21 MED ORDER — CLOPIDOGREL BISULFATE 75 MG PO TABS: 75.0000 mg | ORAL_TABLET | Freq: Every day | ORAL | 3 refills | Status: DC

## 2019-05-21 MED ORDER — OXYCODONE HCL 5 MG PO TABS: 5.0000 mg | ORAL_TABLET | ORAL | 0 refills | Status: DC | PRN

## 2019-05-21 MED ORDER — ATORVASTATIN CALCIUM 80 MG PO TABS: 80.0000 mg | ORAL_TABLET | Freq: Every day | ORAL | 3 refills | Status: DC

## 2019-05-21 MED FILL — Electrolyte-R (PH 7.4) Solution: INTRAVENOUS | Qty: 3000 | Status: AC

## 2019-05-21 MED FILL — Heparin Sodium (Porcine) Inj 1000 Unit/ML: INTRAMUSCULAR | Qty: 30 | Status: AC

## 2019-05-21 MED FILL — Mannitol IV Soln 20%: INTRAVENOUS | Qty: 500 | Status: AC

## 2019-05-21 MED FILL — Magnesium Sulfate Inj 50%: INTRAMUSCULAR | Qty: 20 | Status: AC

## 2019-05-21 MED FILL — Potassium Chloride Inj 2 mEq/ML: INTRAVENOUS | Qty: 40 | Status: AC

## 2019-05-21 MED FILL — Sodium Chloride IV Soln 0.9%: INTRAVENOUS | Qty: 2000 | Status: AC

## 2019-05-21 MED FILL — Heparin Sodium (Porcine) Inj 1000 Unit/ML: INTRAMUSCULAR | Qty: 10 | Status: AC

## 2019-05-21 MED FILL — Lidocaine HCl Local Soln Prefilled Syringe 100 MG/5ML (2%): INTRAMUSCULAR | Qty: 5 | Status: AC

## 2019-05-21 MED FILL — Sodium Bicarbonate IV Soln 8.4%: INTRAVENOUS | Qty: 50 | Status: AC

## 2019-05-21 NOTE — Progress Notes (Signed)
      [address]       [place] [address]             [phone]      4 Days Post-Op Procedure(s) (LRB): CORONARY ARTERY BYPASS GRAFTING (CABG) x Three, using left internal mammary artery and right leg greater saphenous vein harvested endoscopically (N/A) TRANSESOPHAGEAL ECHOCARDIOGRAM (TEE) (N/A)   Subjective:  No complaints, denies chest pain, shortness of breath, N/V.  Up getting ready to walk in the hallway.  She had already done 5 laps this morning.  + BM  Objective: Vital signs in last 24 hours: Temp:  [98 F (36.7 C)-99.9 F (37.7 C)] 98.5 F (36.9 C) (10/13 0723) Pulse Rate:  [80-100] 81 (10/13 0723) Cardiac Rhythm: Normal sinus rhythm (10/13 0723) Resp:  [13-26] 22 (10/13 0723) BP: (102-130)/(51-66) 113/51 (10/13 0723) SpO2:  [93 %-100 %] 100 % (10/13 0723) Weight:  [64 kg] 64 kg (10/13 0618)  Intake/Output from previous day: 10/12 0701 - 10/13 0700 In: 360 [P.O.:360] Out: 500 [Urine:500]  General appearance: alert, cooperative and no distress Heart: regular rate and rhythm Lungs: clear to auscultation bilaterally Abdomen: soft, non-tender; bowel sounds normal; no masses,  no organomegaly Extremities: edema trace Wound: clean and dry  Lab Results: Recent Labs    05/19/19 0351 05/20/19 0535  WBC 11.0* 10.8*  HGB 8.4* 8.7*  HCT 25.3* 26.8*  PLT 194 289   BMET:  Recent Labs    05/19/19 0351 05/20/19 0535  NA 137 137  K 3.5 3.8  CL 102 102  CO2 27 26  GLUCOSE 106* 130*  BUN 13 12  CREATININE 0.64 0.78  CALCIUM 8.5* 8.7*    PT/INR: No results for input(s): LABPROT, INR in the last 72 hours. ABG    Component Value Date/Time   PHART 7.320 (L) 05/17/2019 1939   HCO3 20.2 05/17/2019 1939   TCO2 21 (L) 05/17/2019 1939   ACIDBASEDEF 5.0 (H) 05/17/2019 1939   O2SAT 60.2 05/18/2019 0500   CBG (last 3)  Recent Labs    05/18/19 2312 05/19/19 0309 05/19/19 0750  GLUCAP 111* 102* 130*    Assessment/Plan: S/P Procedure(s)  (LRB): CORONARY ARTERY BYPASS GRAFTING (CABG) x Three, using left internal mammary artery and right leg greater saphenous vein harvested endoscopically (N/A) TRANSESOPHAGEAL ECHOCARDIOGRAM (TEE) (N/A)  1. CV- NSR, brief episode of NSVT- will increase Lopressor to 25 mg BID 2. Pulm- no acute issues, continue IS 3. Renal- creatinine has been stable, weight is trending down, will taper lasix 4. CBGs- have been controlled, preoperative A1c was 6.2 will need to follow up with PCP for prediabetes management 5. Dispo- patient stable, will increase BB for additional HR control, will taper off lasix over next several days, will d/c home today   LOS: 10 days    Ellwood Handler 05/21/2019

## 2019-05-21 NOTE — Progress Notes (Addendum)
Progress Note  Patient Name: Maria Zuniga Date of Encounter: 05/21/2019  Primary Cardiologist: Peter Martinique, MD   Subjective   Plan for discharge today. Denies recurrent chest pain or shortness of breath. Patient ambulating well without symptoms.   Inpatient Medications    Scheduled Meds: . acetaminophen  1,000 mg Oral Q6H  . aspirin EC  81 mg Oral Daily  . atorvastatin  80 mg Oral q1800  . bisacodyl  10 mg Oral Daily   Or  . bisacodyl  10 mg Rectal Daily  . Chlorhexidine Gluconate Cloth  6 each Topical Daily  . clopidogrel  75 mg Oral Daily  . docusate sodium  200 mg Oral Daily  . enoxaparin (LOVENOX) injection  30 mg Subcutaneous QHS  . ezetimibe  10 mg Oral Daily  . ferrous [id number] C-folic acid  1 capsule Oral Q breakfast  . furosemide  40 mg Oral Daily  . levothyroxine  100 mcg Oral QAC breakfast  . mouth rinse  15 mL Mouth Rinse BID  . metoprolol tartrate  25 mg Oral BID  . moving right along book   Does not apply Once  . pantoprazole  40 mg Oral Daily  . potassium chloride  20 mEq Oral Daily  . sodium chloride flush  10-40 mL Intracatheter Q12H  . sodium chloride flush  3 mL Intravenous Q12H   Continuous Infusions:  PRN Meds: levalbuterol, metoprolol tartrate, ondansetron (ZOFRAN) IV, oxyCODONE, sodium chloride flush, sodium chloride flush, traMADol   Vital Signs    Vitals:   05/21/19 0619 05/21/19 0721 05/21/19 0723 05/21/19 0800  BP: 112/60  (!) 113/51 123/61  Pulse:   81 90  Resp: 17  (!) 22 20  Temp: 98 F (36.7 C) 98.5 F (36.9 C) 98.5 F (36.9 C) 98.1 F (36.7 C)  TempSrc: Oral Oral Oral Oral  SpO2: 98%  100% 98%  Weight:      Height:        Intake/Output Summary (Last 24 hours) at 05/21/2019 0913 Last data filed at 05/21/2019 0723 Gross per 24 hour  Intake 480 ml  Output 500 ml  Net -20 ml   Last 3 Weights 05/21/2019 05/20/2019 05/19/2019  Weight (lbs) 141 lb 1.6 oz 143 lb 8.3 oz 147 lb 4.3 oz  Weight (kg) 64.003 kg  65.1 kg 66.8 kg      Telemetry    NSR, HR 80-100 - Personally Reviewed  ECG    No new - Personally Reviewed  Physical Exam   GEN: No acute distress.   Neck: No JVD Cardiac: RRR, no murmurs, rubs, or gallops.  Respiratory: Clear to auscultation bilaterally. GI: Soft, nontender, non-distended  MS: No edema; No deformity. Neuro:  Nonfocal  Psych: Normal affect   Labs    High Sensitivity Troponin:   Recent Labs  Lab 05/11/19 1323 05/11/19 1337  TROPONINIHS >27,000* >27,000*      Chemistry Recent Labs  Lab 05/16/19 0310  05/18/19 1643 05/19/19 0351 05/20/19 0535  NA 138   < > 133* 137 137  K 4.1   < > 3.9 3.5 3.8  CL 104   < > 102 102 102  CO2 22   < > 24 27 26   GLUCOSE 106*   < > 125* 106* 130*  BUN 19   < > 14 13 12   CREATININE 0.96   < > 0.58 0.64 0.78  CALCIUM 9.3   < > 8.7* 8.5* 8.7*  PROT 6.5  --   --   --   --  ALBUMIN 3.4*  --   --   --   --   AST 18  --   --   --   --   ALT 20  --   --   --   --   ALKPHOS 54  --   --   --   --   BILITOT 0.7  --   --   --   --   GFRNONAA >60   < > >60 >60 >60  GFRAA >60   < > >60 >60 >60  ANIONGAP 12   < > 7 8 9    < > = values in this interval not displayed.     Hematology Recent Labs  Lab 05/18/19 1643 05/19/19 0351 05/20/19 0535  WBC 11.2* 11.0* 10.8*  RBC 2.94* 2.64* 2.77*  HGB 9.4* 8.4* 8.7*  HCT 28.2* 25.3* 26.8*  MCV 95.9 95.8 96.8  MCH 32.0 31.8 31.4  MCHC 33.3 33.2 32.5  RDW 13.7 13.6 13.7  PLT 208 194 289    BNPNo results for input(s): BNP, PROBNP in the last 168 hours.   DDimer No results for input(s): DDIMER in the last 168 hours.   Radiology    Dg Chest 2 View  Result Date: 05/20/2019 CLINICAL DATA:  53 year old female with chest pain status post cardiac surgery EXAM: CHEST - 2 VIEW COMPARISON:  05/19/2019, 05/18/2011 FINDINGS: Cardiomediastinal silhouette unchanged in size and contour. Surgical changes of median sternotomy and CABG. Interval removal of right IJ sheath. Epicardial  pacing leads remain. Improving aeration of the lungs with persisting blunting of the right costophrenic angle and asymmetric elevation of the right hemidiaphragm. Coarsened interstitial markings with no confluent airspace disease. No visualized pneumothorax. IMPRESSION: Improving lung aeration with trace right-sided pleural fluid and/or atelectasis. Surgical changes of median sternotomy and CABG. Interval removal of right IJ sheath, with epicardial pacing leads remaining Electronically Signed   By: 07/18/2011 D.O.   On: 05/20/2019 07:57    Cardiac Studies   Echo 05/16/19 1. Severe LV dysfunction with EF 25-30% with swirling of definity contrast in apex. Study suggestive of possible early forming thrombus in LV apex in some views.  FINDINGS Left Ventricle: Left ventricular ejection fraction, by visual estimation, is 60 to 65%. The left ventricle has normal function. There is no left ventricular hypertrophy. Normal left ventricular size. Severe LV dysfunction with EF 25-30% with swirling of definity contrast in apex. Study suggestive of possible early forming thrombus in LV apex in some views.    LHC 05/12/19:  Ost LM to Dist LM lesion is 75% stenosed.  Ost LAD lesion is 95% stenosed.  Ost Cx lesion is 60% stenosed.  Prox RCA lesion is 35% stenosed.  There is moderate left ventricular systolic dysfunction.  LV end diastolic pressure is moderately elevated.  The left ventricular ejection fraction is 35-45% by visual estimate.  1. Severe left main and critical ostial LAD stenosis. Severe dampening of pressures with any catheter engagement.  2. Moderately elevated LVDEP.  3. Moderate LV dysfunction. EF estimated at 35-40% with anteroapical wall motion abnormality.  Echocardiogram on May 12, 2019 IMPRESSIONS  1. Left ventricular ejection fraction, by visual estimation, is 35%. The left ventricle has moderately decreased function. Normal left ventricular size. There is  mildly increased left ventricular hypertrophy. 2. Multiple segmental abnormalities exist. See findings. 3. Definity contrast agent was given IV to delineate the left ventricular endocardial borders. There is loosely organized thrombus at the apex. 4. Global right ventricle  has normal systolic function.The right ventricular size is normal. No increase in right ventricular wall thickness. 5. Left atrial size was normal. 6. Right atrial size was normal. 7. The mitral valve is grossly normal. Trace mitral valve regurgitation. 8. The tricuspid valve is grossly normal. Tricuspid valve regurgitation is trivial. 9. The pulmonic valve was grossly normal. Pulmonic valve regurgitation is not visualized by color flow Doppler. 10. The inferior vena cava is normal in size with greater than 50% respiratory variability, suggesting right atrial pressure of 3 mmHg. 11. The aortic valve was not well visualized Aortic valve regurgitation was not visualized by color flow Doppler. 12. TR signal is inadequate for assessing pulmonary artery systolic pressure.  Patient Profile   53 y.o. female with HTN, Hyperlipidemia, prior tobacco use and family history of CAD here with subacute STEMI treated with CABG.   Assessment & Plan    Subacute STEMI Patient presented on 05/11/19 with over 2 weeks of anginal symptoms. EKG showed dynamic ST changes.  - Cath showed 75% ostial, 60% LC, and 35% RCA - Echo showed EF 35% and concern for apical thrombus in the the left ventricle. Patient was placed on heparin with plan for repeat echo and CABG. Repeat echo showed swirling of definity contrast in apex suggestive early forming thrombus in LV apex.  - Patient underwent CABG and is now post op day 4. - Patient weaned from milrinone over the weekend>> antihypertensives were held - Diuresing well with Lasix 40 mg with potassium 20 mEq daily>>euvolemic on exam. Wt down 149 > 141 lbs - Continued plavix and ASA per CTTS - Patient  was started on Lopressor 12.5 mg>> was increased to 25 mg for possible NSVT episode. After review it appears to be artifact. BP today 123/61. Consider ACE/ARB on discharge if pressure allows - Patient is ambulating well without symptoms - Likely discharge today  Hyperlipidemia - LDL 54 - Continue atorvastatin, Zetia  Hypertension - BP stable - Lopressor was increased to 25 mg daily - Consider ACE/ARB on discharge if pressure allows - Monitor pressures  Tobacco abuse - Quit one year ago  CARDIOLOGY RECOMMENDATIONS:  Discharge is anticipated in the next 48 hours. Recommendations for medications and follow up:  Discharge Medications: Continue medications as they are [facility]. Exceptions to the above:  None  Follow Up: The patient's Primary Cardiologist is Peter SwazilandJordan, MD  Follow up in the office in 2 week(s).  Signed,  Cadence David StallH Furth, PA-C  9:30 AM 05/21/2019  [facility]  I have examined the patient and reviewed assessment and plan and discussed with patient.  Agree with above as stated.    Lance MussJayadeep Makai Dumond   For questions or updates, please contact [facility] Please consult [email or web address] for contact info under

## 2019-05-21 NOTE — Progress Notes (Signed)
Discussed sternal precautions, IS, exercise, diet, and CRPII. Good reception. She quit smoking one year ago. Her son smokes in the house and now they have asked him to smoke outside. Will refer to [place]. Encouraged to weigh daily and watch for fluid retention. [place] CES, ACSM 9:57 AM 05/21/2019

## 2019-05-21 NOTE — Discharge Instructions (Signed)

## 2019-05-21 NOTE — Progress Notes (Signed)
Pt provided discharge instructions and education. IV removed and intact. Telebox removed and ccmd notified. Pt has all belongings. Pt denies any complaints. Vitals stable. Volunteers called to tx pt via wheelchair to valet to meet ride.  Jerald Kief, RN

## 2019-05-21 NOTE — Progress Notes (Signed)
Removed chest tube sutures per order. Pt tolerated well. All incisions painted with Betadine. Jerald Kief, RN

## 2019-05-28 ENCOUNTER — Encounter: Payer: Self-pay | Admitting: Physician Assistant

## 2019-05-28 ENCOUNTER — Ambulatory Visit: Payer: 59 | Admitting: Physician Assistant

## 2019-05-28 ENCOUNTER — Other Ambulatory Visit: Payer: Self-pay

## 2019-05-28 VITALS — BP 122/58 | HR 73 | Temp 97.7°F | Ht 64.0 in | Wt 141.6 lb

## 2019-05-28 DIAGNOSIS — D62 Acute posthemorrhagic anemia: Secondary | ICD-10-CM

## 2019-05-28 DIAGNOSIS — I2510 Atherosclerotic heart disease of native coronary artery without angina pectoris: Secondary | ICD-10-CM | POA: Diagnosis not present

## 2019-05-28 DIAGNOSIS — I10 Essential (primary) hypertension: Secondary | ICD-10-CM | POA: Diagnosis not present

## 2019-05-28 DIAGNOSIS — Z79899 Other long term (current) drug therapy: Secondary | ICD-10-CM

## 2019-05-28 DIAGNOSIS — E785 Hyperlipidemia, unspecified: Secondary | ICD-10-CM

## 2019-05-28 DIAGNOSIS — I255 Ischemic cardiomyopathy: Secondary | ICD-10-CM

## 2019-05-28 DIAGNOSIS — Z87891 Personal history of nicotine dependence: Secondary | ICD-10-CM

## 2019-05-28 DIAGNOSIS — I2102 ST elevation (STEMI) myocardial infarction involving left anterior descending coronary artery: Secondary | ICD-10-CM

## 2019-05-28 MED ORDER — CARVEDILOL 6.25 MG PO TABS: 6.2500 mg | ORAL_TABLET | Freq: Two times a day (BID) | ORAL | 3 refills | Status: DC

## 2019-05-28 MED ORDER — FUROSEMIDE 20 MG PO TABS: 20.0000 mg | ORAL_TABLET | Freq: Every day | ORAL | 1 refills | Status: DC | PRN

## 2019-05-28 MED ORDER — NITROGLYCERIN 0.4 MG SL SUBL: 0.4000 mg | SUBLINGUAL_TABLET | SUBLINGUAL | 2 refills | Status: DC | PRN

## 2019-05-28 NOTE — Progress Notes (Signed)
Cardiology Office Note    Date:  05/30/2019   ID:  Maria PotashDarlene Zuniga, DOB 12-11-1965, MRN [id number]  PCP:  Maria RakesHodges, Francisco, MD  Cardiologist:  Dr. SwazilandJordan   Chief Complaint  Patient presents with  . Follow-up    seen for Dr. SwazilandJordan.    History of Present Illness:  Maria Zuniga is a 53 y.o. female with PMH of HTN, HLD, former tobacco use, and recently diagnosed CAD.  Recently, Maria Zuniga presented to the hospital on 05/11/2019 was intermittent chest pain for 2 weeks.  Initial EKG showed ST elevation in lead V2 and V3 with septal Q waves.  Troponin was greater than 27,000.  She was taken urgently for cardiac catheterization that showed 70% ostial left main lesion, 95% ostial LAD lesion, 60% ostial left circumflex lesion, 45% proximal RCA lesion, EF 35 to 45%.  CT surgery was consulted.  Echocardiogram obtained on 05/12/2019 showed EF 35%, loosely organized thrombus at the apex of LV.  Limited echocardiogram obtained on 05/16/2019 showed EF 25- 30% with swirling of Definity contrast at the apex, possibly early forming thrombus in LV apex in some views.  The study was personally reviewed by Dr. SwazilandJordan who felt there was no convincing evidence of LV thrombus, there was a myocardial band near the apex.  Patient eventually underwent CABG x3 with LIMA to LAD, SVG to OM, and SVG to ramus intermediate by Dr. Tyrone SageGerhardt on 05/17/2019.  Patient presents today for cardiology office visit.  She continues to have some chest soreness, however no exertional symptoms.  She has been able to exercise without exertional chest pain or shortness of breath.  I recommend a CBC and a basic metabolic panel.  She does not have any heart failure symptoms on physical exam.  She denies any orthopnea or PND.  Given her LV dysfunction, I recommended switching her metoprolol to carvedilol 6.25 mg twice daily.  I have given her some sublingual nitroglycerin as needed for angina.  I plan to bring her back in 2 weeks for up titration of  heart failure medication.  Once I finished titrating her heart failure medication, I plan to proceed with 5032-month repeat echocardiogram.   Past Medical History:  Diagnosis Date  . Hyperlipidemia   . Hypertension   . Thyroid disease     Past Surgical History:  Procedure Laterality Date  . CORONARY ARTERY BYPASS GRAFT N/A 05/17/2019   Procedure: CORONARY ARTERY BYPASS GRAFTING (CABG) x Three, using left internal mammary artery and right leg greater saphenous vein harvested endoscopically;  Surgeon: Delight OvensGerhardt, Edward B, MD;  [facility];  Service: Open Heart Surgery;  Laterality: N/A;  . HIP SURGERY    . KNEE SURGERY    . LEFT HEART CATH AND CORONARY ANGIOGRAPHY N/A 05/11/2019   Procedure: LEFT HEART CATH AND CORONARY ANGIOGRAPHY;  Surgeon: SwazilandJordan, Peter M, MD;  [facility];  Service: Cardiovascular;  Laterality: N/A;  . TEE WITHOUT CARDIOVERSION N/A 05/17/2019   Procedure: TRANSESOPHAGEAL ECHOCARDIOGRAM (TEE);  Surgeon: Delight OvensGerhardt, Edward B, MD;  [facility];  Service: Open Heart Surgery;  Laterality: N/A;  . TUBAL LIGATION      Current Medications: Outpatient Medications Prior to Visit  Medication Sig Dispense Refill  . acetaminophen (TYLENOL) 500 MG tablet Take 2 tablets (1,000 mg total) by mouth every 6 (six) hours as needed for mild pain or fever. 30 tablet 0  . aspirin EC 81 MG EC tablet Take 1 tablet (81 mg total) by mouth daily.    .Marland Kitchen  atorvastatin (LIPITOR) 80 MG tablet Take 1 tablet (80 mg total) by mouth daily at 6 PM. 30 tablet 3  . clopidogrel (PLAVIX) 75 MG tablet Take 1 tablet (75 mg total) by mouth daily. 30 tablet 3  . ezetimibe (ZETIA) 10 MG tablet Take 10 mg by mouth daily.    Marland Kitchen levothyroxine (SYNTHROID) 100 MCG tablet Take 100 mcg by mouth daily before breakfast.    . oxyCODONE (OXY IR/ROXICODONE) 5 MG immediate release tablet Take 1-2 tablets (5-10 mg total) by mouth every 4 (four) hours as needed for severe pain. 30 tablet 0  . potassium chloride  SA (KLOR-CON) 20 MEQ tablet Take 1 tablet (20 mEq total) by mouth daily. On days you take Lasix 30 tablet 0  . furosemide (LASIX) 20 MG tablet Take 20 mg by mouth daily as needed for fluid or edema.    . metoprolol tartrate (LOPRESSOR) 25 MG tablet Take 1 tablet (25 mg total) by mouth 2 (two) times daily. 60 tablet 3  . fenofibrate 54 MG tablet Take 54 mg by mouth daily.     No facility-administered medications prior to visit.      Allergies:   Patient has no known allergies.   Social History   Socioeconomic History  . Marital status: Unknown    Spouse name: Not on file  . Number of children: Not on file  . Years of education: Not on file  . Highest education level: Not on file  Occupational History  . Occupation: mail carrier  Social Needs  . Financial resource strain: Not on file  . Food insecurity    Worry: Not on file    Inability: Not on file  . Transportation needs    Medical: Not on file    Non-medical: Not on file  Tobacco Use  . Smoking status: Former Smoker    Types: Cigarettes    Start date: 05/10/2018  . Smokeless tobacco: Never Used  Substance and Sexual Activity  . Alcohol use: Not Currently  . Drug use: Never  . Sexual activity: Not on file  Lifestyle  . Physical activity    Days per week: Not on file    Minutes per session: Not on file  . Stress: Not on file  Relationships  . Social Herbalist on phone: Not on file    Gets together: Not on file    Attends religious service: Not on file    Active member of club or organization: Not on file    Attends meetings of clubs or organizations: Not on file    Relationship status: Not on file  Other Topics Concern  . Not on file  Social History Narrative  . Not on file     Family History:  The patient's family history includes CAD in her mother; Heart attack in her brother; Heart disease in her maternal grandfather, maternal grandmother, and paternal grandmother.   ROS:   Please see the history  of present illness.    ROS All other systems reviewed and are negative.   PHYSICAL EXAM:   VS:  BP (!) 122/58   Pulse 73   Temp 97.7 F (36.5 C)   Ht 5\' 4"  (1.626 m)   Wt 141 lb 9.6 oz (64.2 kg)   BMI 24.31 kg/m    GEN: Well nourished, well developed, in no acute distress  HEENT: normal  Neck: no JVD, carotid bruits, or masses Cardiac: RRR; no murmurs, rubs, or gallops,no edema  Respiratory:  clear to auscultation bilaterally, normal work of breathing GI: soft, nontender, nondistended, + BS MS: no deformity or atrophy  Skin: warm and dry, no rash Neuro:  Alert and Oriented x 3, Strength and sensation are intact Psych: euthymic mood, full affect  Wt Readings from Last 3 Encounters:  05/28/19 141 lb 9.6 oz (64.2 kg)  05/21/19 141 lb 1.6 oz (64 kg)      Studies/Labs Reviewed:   EKG:  EKG is ordered today.  The ekg ordered today demonstrates normal sinus rhythm, T wave inversion in the anterolateral leads.  Q waves in the septal leads.  Recent Labs: 05/11/2019: B Natriuretic Peptide 265.6; TSH 0.941 05/16/2019: ALT 20 05/18/2019: Magnesium 2.2 05/28/2019: BUN 15; Creatinine, Ser 0.71; Hemoglobin 10.4; Platelets 784; Potassium 5.3; Sodium 141   Lipid Panel    Component Value Date/Time   CHOL 93 05/11/2019 1337   TRIG 70 05/11/2019 1337   HDL 25 (L) 05/11/2019 1337   CHOLHDL 3.7 05/11/2019 1337   VLDL 14 05/11/2019 1337   LDLCALC 54 05/11/2019 1337    Additional studies/ records that were reviewed today include:   Cath 05/11/2019  Ost LM to Dist LM lesion is 75% stenosed.  Ost LAD lesion is 95% stenosed.  Ost Cx lesion is 60% stenosed.  Prox RCA lesion is 35% stenosed.  There is moderate left ventricular systolic dysfunction.  LV end diastolic pressure is moderately elevated.  The left ventricular ejection fraction is 35-45% by visual estimate.   1. Severe left main and critical ostial LAD stenosis. Severe dampening of pressures with any catheter  engagement.  2. Moderately elevated LVDEP.  3. Moderate LV dysfunction. EF estimated at 35-40% with anteroapical wall motion abnormality.   Plan: will admit to ICU. Aggressive medical therapy. Will consult CT surgery for consideration of CABG.   Echo 05/12/2019 IMPRESSIONS    1. Left ventricular ejection fraction, by visual estimation, is 35%. The left ventricle has moderately decreased function. Normal left ventricular size. There is mildly increased left ventricular hypertrophy.  2. Multiple segmental abnormalities exist. See findings.  3. Definity contrast agent was given IV to delineate the left ventricular endocardial borders. There is loosely organized thrombus at the apex.  4. Global right ventricle has normal systolic function.The right ventricular size is normal. No increase in right ventricular wall thickness.  5. Left atrial size was normal.  6. Right atrial size was normal.  7. The mitral valve is grossly normal. Trace mitral valve regurgitation.  8. The tricuspid valve is grossly normal. Tricuspid valve regurgitation is trivial.  9. The pulmonic valve was grossly normal. Pulmonic valve regurgitation is not visualized by color flow Doppler. 10. The inferior vena cava is normal in size with greater than 50% respiratory variability, suggesting right atrial pressure of 3 mmHg. 11. The aortic valve was not well visualized Aortic valve regurgitation was not visualized by color flow Doppler. 12. TR signal is inadequate for assessing pulmonary artery systolic pressure.   ASSESSMENT:    1. Coronary artery disease involving native coronary artery of native heart without angina pectoris   2. STEMI involving left anterior descending coronary artery (HCC)   3. Essential hypertension   4. Hyperlipidemia LDL goal <70   5. Former tobacco use   6. Anemia associated with acute blood loss   7. Medication management   8. Ischemic cardiomyopathy      PLAN:  In order of problems listed  above:  1. CAD: Continue aspirin and Plavix.  Denies any chest  pain.  Emphasis has been placed with compliance of dual antiplatelet therapy.  2. Ischemic cardiomyopathy: Switch metoprolol to carvedilol.  Will uptitrate heart failure medication on the next follow-up and obtain 76-month echocardiogram  3. Hypertension: Blood pressure stable.  Obtain base metabolic panel  4. Hyperlipidemia: She will need fasting lipid panel in 6 to 8 weeks, will arrange on the next follow-up  5. Anemia: Obtain CBC    Medication Adjustments/Labs and Tests Ordered: Current medicines are reviewed at length with the patient today.  Concerns regarding medicines are outlined above.  Medication changes, Labs and Tests ordered today are listed in the Patient Instructions below. Patient Instructions  Medication Instructions:   STOP METOPROLOL TARTRATE   START COREG 6.25 MG 2 TIMES A DAY  TAKE NITROGLYCERIN AS NEEDED FOR CHEST PAIN EVERY 5 MINUTES, DO NOT USE MORE THAN 3 TABLETS  *If you need a refill on your cardiac medications before your next appointment, please call your pharmacy*  Lab Work: You will need to have labs (blood work) drawn today:  BMET  CBC If you have labs (blood work) drawn today and your tests are completely normal, you will receive your results only by: Marland Kitchen MyChart Message (if you have MyChart) OR . A paper copy in the mail If you have any lab test that is abnormal or we need to change your treatment, we will call you to review the results.  Testing/Procedures: NONE ordered at this time of appointment   Follow-Up: At [facility], you and your health needs are our priority.  As part of our continuing mission to provide you with exceptional heart care, we have created designated Provider Care Teams.  These Care Teams include your primary Cardiologist (physician) and Advanced Practice Providers (APPs -  Physician Assistants and Nurse Practitioners) who all work together to provide you  with the care you need, when you need it.  Your next appointment:   You will need to follow up in 2 weeks with Azalee Course, PA-C and 3-4 months with Dr. Peter Swaziland  The format for your next appointment:   In Person  Provider:   You may see Peter Swaziland, MD and Azalee Course, PA-C  Other Instructions      Signed, Azalee Course, [place]  05/30/2019 10:41 PM    [facility] [address] [place], [place], [place]  [address] Phone: [phone]; Fax: [phone]

## 2019-05-28 NOTE — Patient Instructions (Signed)
Medication Instructions:   STOP METOPROLOL TARTRATE   START COREG 6.25 MG 2 TIMES A DAY  TAKE NITROGLYCERIN AS NEEDED FOR CHEST PAIN EVERY 5 MINUTES, DO NOT USE MORE THAN 3 TABLETS  *If you need a refill on your cardiac medications before your next appointment, please call your pharmacy*  Lab Work: You will need to have labs (blood work) drawn today:  BMET  CBC If you have labs (blood work) drawn today and your tests are completely normal, you will receive your results only by: Marland Kitchen MyChart Message (if you have MyChart) OR . A paper copy in the mail If you have any lab test that is abnormal or we need to change your treatment, we will call you to review the results.  Testing/Procedures: NONE ordered at this time of appointment   Follow-Up: At [facility], you and your health needs are our priority.  As part of our continuing mission to provide you with exceptional heart care, we have created designated Provider Care Teams.  These Care Teams include your primary Cardiologist (physician) and Advanced Practice Providers (APPs -  Physician Assistants and Nurse Practitioners) who all work together to provide you with the care you need, when you need it.  Your next appointment:   You will need to follow up in 2 weeks with Almyra Deforest, PA-C and 3-4 months with Dr. Peter Martinique  The format for your next appointment:   In Person  Provider:   You may see Peter Martinique, MD and Almyra Deforest, PA-C  Other Instructions

## 2019-05-29 LAB — CBC
Hematocrit: 31 % — ABNORMAL LOW (ref 34.0–46.6)
Hemoglobin: 10.4 g/dL — ABNORMAL LOW (ref 11.1–15.9)
MCH: 31.8 pg (ref 26.6–33.0)
MCHC: 33.5 g/dL (ref 31.5–35.7)
MCV: 95 fL (ref 79–97)
Platelets: 784 10*3/uL — ABNORMAL HIGH (ref 150–450)
RBC: 3.27 x10E6/uL — ABNORMAL LOW (ref 3.77–5.28)
RDW: 13.3 % (ref 11.7–15.4)
WBC: 9.9 10*3/uL (ref 3.4–10.8)

## 2019-05-29 LAB — BASIC METABOLIC PANEL
BUN/Creatinine Ratio: 21 (ref 9–23)
BUN: 15 mg/dL (ref 6–24)
CO2: 22 mmol/L (ref 20–29)
Calcium: 9.6 mg/dL (ref 8.7–10.2)
Chloride: 103 mmol/L (ref 96–106)
Creatinine, Ser: 0.71 mg/dL (ref 0.57–1.00)
GFR calc Af Amer: 111 mL/min/{1.73_m2} (ref >59)
GFR calc non Af Amer: 96 mL/min/{1.73_m2} (ref >59)
Glucose: 67 mg/dL (ref 65–99)
Potassium: 5.3 mmol/L — ABNORMAL HIGH (ref 3.5–5.2)
Sodium: 141 mmol/L (ref 134–144)

## 2019-05-29 NOTE — Progress Notes (Signed)
Stable renal function and electrolyte. Hemoglobin has improved to 10.4, anemia improving

## 2019-05-30 ENCOUNTER — Encounter: Payer: Self-pay | Admitting: Physician Assistant

## 2019-05-30 NOTE — Progress Notes (Signed)
The patient has been notified of the result and verbalized understanding.  All questions (if any) were answered. Maria Zuniga, [place] 05/30/2019 2:41 PM

## 2019-06-10 ENCOUNTER — Other Ambulatory Visit: Payer: Self-pay | Admitting: Cardiothoracic Surgery

## 2019-06-10 DIAGNOSIS — Z951 Presence of aortocoronary bypass graft: Secondary | ICD-10-CM

## 2019-06-11 ENCOUNTER — Ambulatory Visit (INDEPENDENT_AMBULATORY_CARE_PROVIDER_SITE_OTHER): Payer: Self-pay | Admitting: Cardiothoracic Surgery

## 2019-06-11 ENCOUNTER — Encounter: Payer: Self-pay | Admitting: Cardiothoracic Surgery

## 2019-06-11 ENCOUNTER — Ambulatory Visit
Admission: RE | Admit: 2019-06-11 | Discharge: 2019-06-11 | Disposition: A | Discharge status: Home / Self Care | Payer: 59 | Source: Ambulatory Visit | Attending: Cardiothoracic Surgery | Admitting: Cardiothoracic Surgery

## 2019-06-11 ENCOUNTER — Other Ambulatory Visit: Payer: Self-pay

## 2019-06-11 VITALS — BP 136/70 | HR 78 | Temp 97.8°F | Resp 20 | Ht 64.0 in | Wt 142.0 lb

## 2019-06-11 DIAGNOSIS — Z951 Presence of aortocoronary bypass graft: Secondary | ICD-10-CM

## 2019-06-11 DIAGNOSIS — I2510 Atherosclerotic heart disease of native coronary artery without angina pectoris: Secondary | ICD-10-CM

## 2019-06-11 NOTE — Progress Notes (Signed)
[address]       [place] [address]             [phone]      Maria Zuniga [facility] Medical Record #[id number] Date of Birth: 28-Feb-1966  Referring: No ref. provider found Primary Care: Charlott Rakes, MD Primary Cardiologist: Peter Swaziland, MD   Chief Complaint:   POST OP FOLLOW UP OPERATIVE REPORT DATE OF PROCEDURE:  05/17/2019 PREOPERATIVE DIAGNOSES:  Coronary occlusive disease with recent anterior apical transmural myocardial infarction with high-grade left anterior descending and left main disease. POSTOPERATIVE DIAGNOSES:  Coronary occlusive disease with recent anterior apical transmural myocardial infarction with high-grade left anterior descending and left main disease. SURGICAL PROCEDURE:  Coronary artery bypass grafting x3 with the left internal mammary to the left anterior descending coronary artery, reverse saphenous vein graft to the intermediate coronary artery, reverse saphenous vein graft to the circumflex  coronary artery with right thigh endoscopic vein harvesting of the right thigh greater saphenous vein.  History of Present Illness:     Patient returns to the office today in follow-up after her coronary artery bypass grafting done on October 9.  She originally presented with a acute myocardial infarction with significant anterior wall hypo and akinesis initial echo suggested possibility of intraventricular clot subsequent echoes and TEE did not confirm this.  The patient is rapidly returned to near normal activities.  She notes that she walks around a 10 acre area near her house without difficulty.  She denies orthopnea pedal edema or other symptoms of congestive heart failure. She is not smoking She notes continues continues on aspirin and Plavix as prescribed  Past Medical History:  Diagnosis Date  . Hyperlipidemia   . Hypertension   . Thyroid disease      Social History   Tobacco Use  Smoking Status Former Smoker  .  Types: Cigarettes  . Start date: 05/10/2018  Smokeless Tobacco Never Used    Social History   Substance and Sexual Activity  Alcohol Use Not Currently     No Known Allergies  Current Outpatient Medications  Medication Sig Dispense Refill  . acetaminophen (TYLENOL) 500 MG tablet Take 2 tablets (1,000 mg total) by mouth every 6 (six) hours as needed for mild pain or fever. 30 tablet 0  . aspirin EC 81 MG EC tablet Take 1 tablet (81 mg total) by mouth daily.    Marland Kitchen atorvastatin (LIPITOR) 80 MG tablet Take 1 tablet (80 mg total) by mouth daily at 6 PM. 30 tablet 3  . carvedilol (COREG) 6.25 MG tablet Take 1 tablet (6.25 mg total) by mouth 2 (two) times daily. 180 tablet 3  . clopidogrel (PLAVIX) 75 MG tablet Take 1 tablet (75 mg total) by mouth daily. 30 tablet 3  . ezetimibe (ZETIA) 10 MG tablet Take 10 mg by mouth daily.    . fenofibrate 54 MG tablet Take 54 mg by mouth daily.    . furosemide (LASIX) 20 MG tablet Take 1 tablet (20 mg total) by mouth daily as needed for fluid or edema. 30 tablet 1  . levothyroxine (SYNTHROID) 100 MCG tablet Take 100 mcg by mouth daily before breakfast.    . nitroGLYCERIN (NITROSTAT) 0.4 MG SL tablet Place 1 tablet (0.4 mg total) under the tongue every 5 (five) minutes as needed for chest pain. DO NOT USE NO MORE THAN 3 TABLETS 25 tablet 2  . oxyCODONE (OXY IR/ROXICODONE) 5 MG immediate release tablet Take 1-2 tablets (  5-10 mg total) by mouth every 4 (four) hours as needed for severe pain. 30 tablet 0  . potassium chloride SA (KLOR-CON) 20 MEQ tablet Take 1 tablet (20 mEq total) by mouth daily. On days you take Lasix 30 tablet 0   No current facility-administered medications for this visit.        Physical Exam: BP 136/70   Pulse 78   Temp 97.8 F (36.6 C) (Skin)   Resp 20   Ht 5\' 4"  (1.626 m)   Wt 142 lb (64.4 kg)   SpO2 97% Comment: RA  BMI 24.37 kg/m   General appearance: alert, cooperative and no distress Neurologic: intact Heart: regular  rate and rhythm, S1, S2 normal, no murmur, click, rub or gallop Lungs: clear to auscultation bilaterally Abdomen: soft, non-tender; bowel sounds normal; no masses,  no organomegaly Extremities: extremities normal, atraumatic, no cyanosis or edema and Homans sign is negative, no sign of DVT Wound: Sternum is stable and well-healed Vein harvest site right leg is also healing well  Diagnostic Studies & Laboratory data:     Recent Radiology Findings:   Dg Chest 2 View  Result Date: 06/11/2019 CLINICAL DATA:  CABG 05/17/2019. EXAM: CHEST - 2 VIEW COMPARISON:  05/20/2019. FINDINGS: Trachea is midline. Heart size within normal limits. 7 intact median sternotomy wires. There may be fissural thickening on the lateral view. Lungs are otherwise clear. No pleural fluid. IMPRESSION: No acute findings. Electronically Signed   By: Lorin Picket M.D.   On: 06/11/2019 15:03    I have independently reviewed the above radiology studies  and reviewed the findings with the patient.    Recent Lab Findings: Lab Results  Component Value Date   WBC 9.9 05/28/2019   HGB 10.4 (L) 05/28/2019   HCT 31.0 (L) 05/28/2019   PLT 784 (H) 05/28/2019   GLUCOSE 67 05/28/2019   CHOL 93 05/11/2019   TRIG 70 05/11/2019   HDL 25 (L) 05/11/2019   LDLCALC 54 05/11/2019   ALT 20 05/16/2019   AST 18 05/16/2019   NA 141 05/28/2019   K 5.3 (H) 05/28/2019   CL 103 05/28/2019   CREATININE 0.71 05/28/2019   BUN 15 05/28/2019   CO2 22 05/28/2019   TSH 0.941 05/11/2019   INR 1.3 (H) 05/17/2019   HGBA1C 6.2 (H) 05/11/2019      Assessment / Plan:   Patient is making excellent progress postoperatively especially considering significant LV dysfunction at the time of her initial presentation she is now currently without signs or symptoms of angina or congestive heart failure.  She is increasing her physical activity appropriately. She is very determined not to return to smoking, was counseled on continued on aspirin and  Plavix and statin.  I have allowed her to return to driving, cautioned about lifting over 20 to 25 pounds for 3 months.  She has close heart failure follow-up through cardiology office, with plans for a follow-up echocardiogram 3 months postop  Plan to see her back as needed  Medication Changes: No orders of the defined types were placed in this encounter.     [name] MD      [place] [place] [address] Office [phone]     06/11/2019 4:25 PM

## 2019-06-13 ENCOUNTER — Telehealth: Payer: Self-pay | Admitting: Cardiology

## 2019-06-13 ENCOUNTER — Ambulatory Visit: Payer: 59 | Admitting: Cardiothoracic Surgery

## 2019-06-13 NOTE — Telephone Encounter (Signed)
Patient states she is supposed to have a f/u appt with Almyra Deforest following her Cardiac F/U visit today with Dr. Servando Snare. She has an appt with Dr. Martinique on 08/28/18 but I do not see anything that states she is to have a f/u appt with Almyra Deforest. Want's to speak with a nurse, please advise.

## 2019-06-27 NOTE — Telephone Encounter (Signed)
Virtual visit with me is ok. Just need to uptitrate CHF med prior to Dr. Doug Sou visit.

## 2019-07-03 ENCOUNTER — Telehealth: Payer: Self-pay

## 2019-07-03 NOTE — Telephone Encounter (Signed)
Virtual Visit Pre-Appointment Phone Call  "(Name), I am calling you today to discuss your upcoming appointment. We are currently trying to limit exposure to the virus that causes COVID-19 by seeing patients at home rather than in the office."  1. "What is the BEST phone number to call the day of the visit?" - include this in appointment notes  2. "Do you have or have access to (through a family member/friend) a smartphone with video capability that we can use for your visit?" a. If yes - list this number in appt notes as "cell" (if different from BEST phone #) and list the appointment type as a VIDEO visit in appointment notes b. If no - list the appointment type as a PHONE visit in appointment notes  3. Confirm consent - "In the setting of the current Covid19 crisis, you are scheduled for a (phone or video) visit with your provider on (date) at (time).  Just as we do with many in-office visits, in order for you to participate in this visit, we must obtain consent.  If you'd like, I can send this to your mychart (if signed up) or email for you to review.  Otherwise, I can obtain your verbal consent now.  All virtual visits are billed to your insurance company just like a normal visit would be.  By agreeing to a virtual visit, we'd like you to understand that the technology does not allow for your provider to perform an examination, and thus may limit your provider's ability to fully assess your condition. If your provider identifies any concerns that need to be evaluated in person, we will make arrangements to do so.  Finally, though the technology is pretty good, we cannot assure that it will always work on either your or our end, and in the setting of a video visit, we may have to convert it to a phone-only visit.  In either situation, we cannot ensure that we have a secure connection.  Are you willing to proceed?" STAFF: Did the patient verbally acknowledge consent to telehealth visit? Document  YES/NO here: YES  4. Advise patient to be prepared - "Two hours prior to your appointment, go ahead and check your blood pressure, pulse, oxygen saturation, and your weight (if you have the equipment to check those) and write them all down. When your visit starts, your provider will ask you for this information. If you have an Apple Watch or Kardia device, please plan to have heart rate information ready on the day of your appointment. Please have a pen and paper handy nearby the day of the visit as well."  5. Give patient instructions for MyChart download to smartphone OR [email or web address] as below if video visit (depending on what platform provider is using)  6. Inform patient they will receive a phone call 15 minutes prior to their appointment time (may be from unknown caller ID) so they should be prepared to answer    TELEPHONE CALL NOTE  Fynley Chrystal has been deemed a candidate for a follow-up tele-health visit to limit community exposure during the Covid-19 pandemic. I spoke with the patient via phone to ensure availability of phone/video source, confirm preferred email & phone number, and discuss instructions and expectations.  I reminded Brightyn Mozer to be prepared with any vital sign and/or heart rhythm information that could potentially be obtained via home monitoring, at the time of her visit. I reminded Paulla Mcclaskey to expect a phone call prior to her visit.  Dorris Fetch, CMA 07/03/2019 4:13 PM   INSTRUCTIONS FOR DOWNLOADING THE MYCHART APP TO SMARTPHONE  - The patient must first make sure to have activated MyChart and know their login information - If Apple, go to [employer] and type in MyChart in the search bar and download the app. If Android, ask patient to go to [employer] and type in [place] in the search bar and download the app. The app is free but as with any other app downloads, their phone may require them to verify saved payment information or Apple/Android  password.  - The patient will need to then log into the app with their MyChart username and password, and select [facility] as their healthcare provider to link the account. When it is time for your visit, go to the MyChart app, find appointments, and click Begin Video Visit. Be sure to Select Allow for your device to access the Microphone and Camera for your visit. You will then be connected, and your provider will be with you shortly.  **If they have any issues connecting, or need assistance please contact MyChart service desk [phone])**  **If using a computer, in order to ensure the best quality for their visit they will need to use either of the following Internet Browsers: [employer] or Google Chrome**  IF USING DOXIMITY or DOXY.ME - The patient will receive a link just prior to their visit by text.     FULL LENGTH CONSENT FOR TELE-HEALTH VISIT   I hereby voluntarily request, consent and authorize [facility] and its employed or contracted physicians, physician assistants, nurse practitioners or other licensed health care professionals (the Practitioner), to provide me with telemedicine health care services (the "Services") as deemed necessary by the treating Practitioner. I acknowledge and consent to receive the Services by the Practitioner via telemedicine. I understand that the telemedicine visit will involve communicating with the Practitioner through live audiovisual communication technology and the disclosure of certain medical information by electronic transmission. I acknowledge that I have been given the opportunity to request an in-person assessment or other available alternative prior to the telemedicine visit and am voluntarily participating in the telemedicine visit.  I understand that I have the right to withhold or withdraw my consent to the use of telemedicine in the course of my care at any time, without affecting my right to future care or treatment,  and that the Practitioner or I may terminate the telemedicine visit at any time. I understand that I have the right to inspect all information obtained and/or recorded in the course of the telemedicine visit and may receive copies of available information for a reasonable fee.  I understand that some of the potential risks of receiving the Services via telemedicine include:  Marland Kitchen Delay or interruption in medical evaluation due to technological equipment failure or disruption; . Information transmitted may not be sufficient (e.g. poor resolution of images) to allow for appropriate medical decision making by the Practitioner; and/or  . In rare instances, security protocols could fail, causing a breach of personal health information.  Furthermore, I acknowledge that it is my responsibility to provide information about my medical history, conditions and care that is complete and accurate to the best of my ability. I acknowledge that Practitioner's advice, recommendations, and/or decision may be based on factors not within their control, such as incomplete or inaccurate data provided by me or distortions of diagnostic images or specimens that may result from electronic transmissions. I understand that the  practice of medicine is not an Chief Strategy Officer and that Practitioner makes no warranties or guarantees regarding treatment outcomes. I acknowledge that I will receive a copy of this consent concurrently upon execution via email to the email address I last provided but may also request a printed copy by calling the office of [facility].    I understand that my insurance will be billed for this visit.   I have read or had this consent read to me. . I understand the contents of this consent, which adequately explains the benefits and risks of the Services being provided via telemedicine.  . I have been provided ample opportunity to ask questions regarding this consent and the Services and have had my questions  answered to my satisfaction. . I give my informed consent for the services to be provided through the use of telemedicine in my medical care  By participating in this telemedicine visit I agree to the above.

## 2019-07-03 NOTE — Telephone Encounter (Addendum)
Patient is scheduled for a virtual visit with [facility] on 07/08/19 at 9:00AM. Virtual Consent was explained to patient. Patient verbalized an understanding and all questions were answered.

## 2019-07-08 ENCOUNTER — Telehealth (INDEPENDENT_AMBULATORY_CARE_PROVIDER_SITE_OTHER): Payer: 59 | Admitting: Physician Assistant

## 2019-07-08 ENCOUNTER — Encounter: Payer: Self-pay | Admitting: Physician Assistant

## 2019-07-08 VITALS — BP 116/64 | HR 89 | Ht 65.0 in | Wt 140.0 lb

## 2019-07-08 DIAGNOSIS — Z79899 Other long term (current) drug therapy: Secondary | ICD-10-CM | POA: Diagnosis not present

## 2019-07-08 DIAGNOSIS — I255 Ischemic cardiomyopathy: Secondary | ICD-10-CM

## 2019-07-08 MED ORDER — LISINOPRIL 2.5 MG PO TABS: 2.5000 mg | ORAL_TABLET | Freq: Every day | ORAL | 0 refills | Status: DC

## 2019-07-08 NOTE — Progress Notes (Signed)
Virtual Visit via Telephone Note   This visit type was conducted due to national recommendations for restrictions regarding the COVID-19 Pandemic (e.g. social distancing) in an effort to limit this patient's exposure and mitigate transmission in our community.  Due to her co-morbid illnesses, this patient is at least at moderate risk for complications without adequate follow up.  This format is felt to be most appropriate for this patient at this time.  The patient did not have access to video technology/had technical difficulties with video requiring transitioning to audio format only (telephone).  All issues noted in this document were discussed and addressed.  No physical exam could be performed with this format.  Please refer to the patient's chart for her  consent to telehealth for [facility].   Date:  07/10/2019   ID:  Maria Zuniga, DOB [birth year], MRN [id number]  Patient Location: Home Provider Location: Home  PCP:  Charlott Rakes, MD  Cardiologist:  Peter Swaz[place], MD  Electrophysiologist:  None   Evaluation Performed:  Follow-Up Visit  Chief Complaint:  CHF medication titration  History of Present Illness:    Maria Zuniga is a 53 y.o. female with PMH of HTN, HLD, former tobacco use, and recently diagnosed CAD.  Recently, Maria Zuniga presented to the hospital on 05/11/2019 was intermittent chest pain for 2 weeks.  Initial EKG showed ST elevation in lead V2 and V3 with septal Q waves.  Troponin was greater than 27,000.  She was taken urgently for cardiac catheterization that showed 70% ostial left main lesion, 95% ostial LAD lesion, 60% ostial left circumflex lesion, 45% proximal RCA lesion, EF 35 to 45%.  CT surgery was consulted.  Echocardiogram obtained on 05/12/2019 showed EF 35%, loosely organized thrombus at the apex of LV.  Limited echocardiogram obtained on 05/16/2019 showed EF 25- 30% with swirling of Definity contrast at the apex, possibly early forming thrombus in LV apex  in some views.  The study was personally reviewed by Dr. [place] who felt there was no convincing evidence of LV thrombus, there was a myocardial band near the apex.  Patient eventually underwent CABG x3 with LIMA to LAD, SVG to OM, and SVG to ramus intermediate by Dr. Tyrone Sage on 05/17/2019.  I last saw the patient on 05/28/2019, she did not have any heart failure symptoms on presentation.  Given her LV dysfunction, I recommended switching her metoprolol to carvedilol 6.25 mg twice daily.  I also prescribed her some sublingual nitroglycerin as needed for angina.  She was initially contacted as a video visit, however due to technical difficulty, I was unable to hear her voice through the video.  I later converted this to a telephone visit instead.  She has been able to tolerate carvedilol and he denies any significant chest discomfort.  She does have some tingling sensation near the sternotomy site which I think is normal.  I recommend the addition of low-dose lisinopril 2.5 mg daily for her LV dysfunction.  Otherwise she does not have any lower extremity edema, orthopnea or PND.  The patient does not have symptoms concerning for COVID-19 infection (fever, chills, cough, or new shortness of breath).    Past Medical History:  Diagnosis Date  . Hyperlipidemia   . Hypertension   . Thyroid disease    Past Surgical History:  Procedure Laterality Date  . CORONARY ARTERY BYPASS GRAFT N/A 05/17/2019   Procedure: CORONARY ARTERY BYPASS GRAFTING (CABG) x Three, using left internal mammary artery and right leg greater saphenous vein  harvested endoscopically;  Surgeon: [name], MD;  Location: [place];  Service: Open Heart Surgery;  Laterality: N/A;  . HIP SURGERY    . KNEE SURGERY    . LEFT HEART CATH AND CORONARY ANGIOGRAPHY N/A 05/11/2019   Procedure: LEFT HEART CATH AND CORONARY ANGIOGRAPHY;  Surgeon: Martinique, Peter M, MD;  Location: [place] [facility];  Service: Cardiovascular;  Laterality: N/A;  .  TEE WITHOUT CARDIOVERSION N/A 05/17/2019   Procedure: TRANSESOPHAGEAL ECHOCARDIOGRAM (TEE);  Surgeon: [name], MD;  Location: [place];  Service: Open Heart Surgery;  Laterality: N/A;  . TUBAL LIGATION       Current Meds  Medication Sig  . aspirin EC 81 MG EC tablet Take 1 tablet (81 mg total) by mouth daily.  Marland Kitchen atorvastatin (LIPITOR) 80 MG tablet Take 1 tablet (80 mg total) by mouth daily at 6 PM.  . carvedilol (COREG) 6.25 MG tablet Take 1 tablet (6.25 mg total) by mouth 2 (two) times daily.  . clopidogrel (PLAVIX) 75 MG tablet Take 1 tablet (75 mg total) by mouth daily.  Marland Kitchen ezetimibe (ZETIA) 10 MG tablet Take 10 mg by mouth daily.  . fenofibrate 54 MG tablet Take 54 mg by mouth daily.  Marland Kitchen levothyroxine (SYNTHROID) 100 MCG tablet Take 100 mcg by mouth daily before breakfast.  . potassium chloride SA (KLOR-CON) 20 MEQ tablet Take 1 tablet (20 mEq total) by mouth daily. On days you take Lasix     Allergies:   Patient has no known allergies.   Social History   Tobacco Use  . Smoking status: Former Smoker    Types: Cigarettes    Start date: 05/10/2018  . Smokeless tobacco: Never Used  Substance Use Topics  . Alcohol use: Not Currently  . Drug use: Never     Family Hx: The patient's family history includes CAD in her mother; Heart attack in her brother; Heart disease in her maternal grandfather, maternal grandmother, and paternal grandmother.  ROS:   Please see the history of present illness.     All other systems reviewed and are negative.   Prior CV studies:   The following studies were reviewed today:  Cath 05/11/2019  Ost LM to Dist LM lesion is 75% stenosed.  Ost LAD lesion is 95% stenosed.  Ost Cx lesion is 60% stenosed.  Prox RCA lesion is 35% stenosed.  There is moderate left ventricular systolic dysfunction.  LV end diastolic pressure is moderately elevated.  The left ventricular ejection fraction is 35-45% by visual estimate.  1. Severe left main  and critical ostial LAD stenosis. Severe dampening of pressures with any catheter engagement.  2. Moderately elevated LVDEP.  3. Moderate LV dysfunction. EF estimated at 35-40% with anteroapical wall motion abnormality.   Plan: will admit to ICU. Aggressive medical therapy. Will consult CT surgery for consideration of CABG.   Echo 05/12/2019 IMPRESSIONS   1. Left ventricular ejection fraction, by visual estimation, is 35%. The left ventricle has moderately decreased function. Normal left ventricular size. There is mildly increased left ventricular hypertrophy. 2. Multiple segmental abnormalities exist. See findings. 3. Definity contrast agent was given IV to delineate the left ventricular endocardial borders. There is loosely organized thrombus at the apex. 4. Global right ventricle has normal systolic function.The right ventricular size is normal. No increase in right ventricular wall thickness. 5. Left atrial size was normal. 6. Right atrial size was normal. 7. The mitral valve is grossly normal. Trace mitral valve regurgitation. 8. The  tricuspid valve is grossly normal. Tricuspid valve regurgitation is trivial. 9. The pulmonic valve was grossly normal. Pulmonic valve regurgitation is not visualized by color flow Doppler. 10. The inferior vena cava is normal in size with greater than 50% respiratory variability, suggesting right atrial pressure of 3 mmHg. 11. The aortic valve was not well visualized Aortic valve regurgitation was not visualized by color flow Doppler. 12. TR signal is inadequate for assessing pulmonary artery systolic pressure.   Labs/Other Tests and Data Reviewed:    EKG:  An ECG dated 05/28/2019 was personally reviewed today and demonstrated:  Normal sinus rhythm, T wave inversion in the anterolateral leads, Q waves in the septal leads.  Recent Labs: 05/11/2019: B Natriuretic Peptide 265.6; TSH 0.941 05/16/2019: ALT 20 05/18/2019: Magnesium 2.2  05/28/2019: BUN 15; Creatinine, Ser 0.71; Hemoglobin 10.4; Platelets 784; Potassium 5.3; Sodium 141   Recent Lipid Panel Lab Results  Component Value Date/Time   CHOL 93 05/11/2019 01:37 PM   TRIG 70 05/11/2019 01:37 PM   HDL 25 (L) 05/11/2019 01:37 PM   CHOLHDL 3.7 05/11/2019 01:37 PM   LDLCALC 54 05/11/2019 01:37 PM    Wt Readings from Last 3 Encounters:  07/08/19 140 lb (63.5 kg)  06/11/19 142 lb (64.4 kg)  05/28/19 141 lb 9.6 oz (64.2 kg)     Objective:    Vital Signs:  BP 116/64   Pulse 89   Ht 5\' 5"  (1.651 m)   Wt 140 lb (63.5 kg)   BMI 23.30 kg/m    VITAL SIGNS:  reviewed  ASSESSMENT & PLAN:    1. Coronary artery disease status post CABG x3: Followed by Dr. Nydia BoutonGearhart.  Continue aspirin and Plavix.  Numbness and the tingling sensation near the sternotomy site, otherwise she is recovering quite well  2. Ischemic cardiomyopathy: Tolerating low-dose carvedilol.  We will add low-dose lisinopril 2.5 mg daily.  If systolic blood pressure greater than 100 in 2 weeks, she may increase lisinopril to 5 mg daily.  Obtain basic metabolic panel in 2 weeks.  Recommend repeat echocardiogram prior to the next office visit.  3. Hypertension: Blood pressure stable  4. Hyperlipidemia: Obtain fasting lipid panel.  COVID-19 Education: The signs and symptoms of COVID-19 were discussed with the patient and how to seek care for testing (follow up with PCP or arrange E-visit).  The importance of social distancing was discussed today.  Time:   Today, I have spent 16 minutes with the patient with telehealth technology discussing the above problems.     Medication Adjustments/Labs and Tests Ordered: Current medicines are reviewed at length with the patient today.  Concerns regarding medicines are outlined above.   Tests Ordered: Orders Placed This Encounter  Procedures  . Basic metabolic panel  . ECHOCARDIOGRAM COMPLETE    Medication Changes: Meds ordered this encounter   Medications  . lisinopril (ZESTRIL) 2.5 MG tablet    Sig: Take 1 tablet (2.5 mg total) by mouth daily.    Dispense:  90 tablet    Refill:  0    Follow Up:  In Person in 2 month(s)  Signed, Azalee CourseHao Harrington Jobe, [place]  07/10/2019 8:42 PM    [facility]

## 2019-07-08 NOTE — Patient Instructions (Addendum)
Medication Instructions:   START Lisinopril 2.5 mg daily  *If you need a refill on your cardiac medications before your next appointment, please call your pharmacy*  Lab Work: You will need to have labs (blood work) drawn in 1-2 weeks:  BMET If you have labs (blood work) drawn today and your tests are completely normal, you will receive your results only by: Marland Kitchen MyChart Message (if you have MyChart) OR . A paper copy in the mail If you have any lab test that is abnormal or we need to change your treatment, we will call you to review the results.  Testing/Procedures: Your physician has requested that you have an echocardiogram. Echocardiography is a painless test that uses sound waves to create images of your heart. It provides your doctor with information about the size and shape of your heart and how well your heart's chambers and valves are working. This procedure takes approximately one hour. There are no restrictions for this procedure.   PLEASE SCHEDULE FOR 08/25/2018 OR 08/27/2019  Follow-Up: At [facility], you and your health needs are our priority.  As part of our continuing mission to provide you with exceptional heart care, we have created designated Provider Care Teams.  These Care Teams include your primary Cardiologist (physician) and Advanced Practice Providers (APPs -  Physician Assistants and Nurse Practitioners) who all work together to provide you with the care you need, when you need it.  Your next appointment:   Is scheduled for August 28, 2019  The format for your next appointment:   In Person  Provider:   Peter Martinique, MD  Other Instructions  Continue to monitor blood pressure, if Systolic (top number) blood pressure is greater than 100 in 1-2 weeks you may increase your Lisinopril to 5 mg daily, also give our office a call

## 2019-08-22 NOTE — Progress Notes (Signed)
Virtual Visit via Telephone Note   This visit type was conducted due to national recommendations for restrictions regarding the COVID-19 Pandemic (e.g. social distancing) in an effort to limit this patient's exposure and mitigate transmission in our community.  Due to her co-morbid illnesses, this patient is at least at moderate risk for complications without adequate follow up.  This format is felt to be most appropriate for this patient at this time.  The patient did not have access to video technology/had technical difficulties with video requiring transitioning to audio format only (telephone).  All issues noted in this document were discussed and addressed.  No physical exam could be performed with this format.  Please refer to the patient's chart for her  consent to telehealth for [facility].   Date:  08/28/2019   ID:  Maria Zuniga, DOB 08/24/1965, MRN [id number]  Patient Location: Home Provider Location: Home  PCP:  Maryella Shivers, MD  Cardiologist:  Satonya Lux Martin[place], MD  Electrophysiologist:  None   Evaluation Performed:  Follow-Up Visit  Chief Complaint:  CHF   History of Present Illness:    Maria Zuniga is a 54 y.o. female with PMH of HTN, HLD, former tobacco use, and  CAD.  She presented to the hospital on 05/11/2019 was intermittent chest pain for 2 weeks.  Initial EKG showed ST elevation in lead V2 and V3 with septal Q waves.  Troponin was greater than 27,000.  She was taken urgently for cardiac catheterization that showed 70% ostial left main lesion, 95% ostial LAD lesion, 60% ostial left circumflex lesion, 45% proximal RCA lesion, EF 35 to 45%.  CT surgery was consulted.  Echocardiogram obtained on 05/12/2019 showed EF 35%, loosely organized thrombus at the apex of LV.  Limited echocardiogram obtained on 05/16/2019 showed EF 25- 30% with swirling of Definity contrast at the apex, possibly early forming thrombus in LV apex in some views.  The study was personally reviewed and I  felt there was no convincing evidence of LV thrombus, there was a myocardial band near the apex.  Patient eventually underwent CABG x3 with LIMA to LAD, SVG to OM, and SVG to ramus intermediate by Dr. Servando Snare on 05/17/2019.  She was placed on Coreg and low dose lisinopril. Plan to continue DAPT for one year. Repeat Echo this week showed improvement in EF to 40-45%.   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 Denies any chest pain, dyspnea, palpitations. Energy is good. Tolerating medication well.   The patient does not have symptoms concerning for COVID-19 infection (fever, chills, cough, or new shortness of breath).    Past Medical History:  Diagnosis Date  . Hyperlipidemia   . Hypertension   . Thyroid disease    Past Surgical History:  Procedure Laterality Date  . CORONARY ARTERY BYPASS GRAFT N/A 05/17/2019   Procedure: CORONARY ARTERY BYPASS GRAFTING (CABG) x Three, using left internal mammary artery and right leg greater saphenous vein harvested endoscopically;  Surgeon: [name], MD;  Location: [place];  Service: Open Heart Surgery;  Laterality: N/A;  . HIP SURGERY    . KNEE SURGERY    . LEFT HEART CATH AND CORONARY ANGIOGRAPHY N/A 05/11/2019   Procedure: LEFT HEART CATH AND CORONARY ANGIOGRAPHY;  Surgeon: Martinique, Elcie Pelster M, MD;  Location: [place] CV [facility];  Service: Cardiovascular;  Laterality: N/A;  . TEE WITHOUT CARDIOVERSION N/A 05/17/2019   Procedure: TRANSESOPHAGEAL ECHOCARDIOGRAM (TEE);  Surgeon: [name], MD;  Location: [place];  Service: Open Heart Surgery;  Laterality: N/A;  . TUBAL LIGATION       Current Meds  Medication Sig  . acetaminophen (TYLENOL) 500 MG tablet Take 2 tablets (1,000 mg total) by mouth every 6 (six) hours as needed for mild pain or fever.  Marland Kitchen aspirin EC 81 MG EC tablet Take 1 tablet (81 mg total) by mouth daily.  Marland Kitchen atorvastatin (LIPITOR) 80 MG tablet Take 1 tablet (80 mg total) by mouth daily at 6 PM.  . carvedilol (COREG) 6.25  MG tablet Take 1 tablet (6.25 mg total) by mouth 2 (two) times daily.  . clopidogrel (PLAVIX) 75 MG tablet Take 1 tablet (75 mg total) by mouth daily.  Marland Kitchen ezetimibe (ZETIA) 10 MG tablet Take 10 mg by mouth daily.  . furosemide (LASIX) 20 MG tablet Take 1 tablet (20 mg total) by mouth daily as needed for fluid or edema.  Marland Kitchen levothyroxine (SYNTHROID) 100 MCG tablet Take 100 mcg by mouth daily before breakfast.  . lisinopril (ZESTRIL) 2.5 MG tablet Take 1 tablet (2.5 mg total) by mouth daily.  . nitroGLYCERIN (NITROSTAT) 0.4 MG SL tablet Place 1 tablet (0.4 mg total) under the tongue every 5 (five) minutes as needed for chest pain.  Marland Kitchen oxyCODONE (OXY IR/ROXICODONE) 5 MG immediate release tablet Take 1-2 tablets (5-10 mg total) by mouth every 4 (four) hours as needed for severe pain.  . potassium chloride SA (KLOR-CON) 20 MEQ tablet Take 20 meq daily as needed if takes Lasix  . [DISCONTINUED] potassium chloride SA (KLOR-CON) 20 MEQ tablet Take 1 tablet (20 mEq total) by mouth daily. On days you take Lasix     Allergies:   Patient has no known allergies.   Social History   Tobacco Use  . Smoking status: Former Smoker    Types: Cigarettes    Start date: 05/10/2018  . Smokeless tobacco: Never Used  Substance Use Topics  . Alcohol use: Not Currently  . Drug use: Never     Family Hx: The patient's family history includes CAD in her mother; Heart attack in her brother; Heart disease in her maternal grandfather, maternal grandmother, and paternal grandmother.  ROS:   Please see the history of present illness.     All other systems reviewed and are negative.   Prior CV studies:   The following studies were reviewed today:  Cath 05/11/2019  Ost LM to Dist LM lesion is 75% stenosed.  Ost LAD lesion is 95% stenosed.  Ost Cx lesion is 60% stenosed.  Prox RCA lesion is 35% stenosed.  There is moderate left ventricular systolic dysfunction.  LV end diastolic pressure is moderately  elevated.  The left ventricular ejection fraction is 35-45% by visual estimate.  1. Severe left main and critical ostial LAD stenosis. Severe dampening of pressures with any catheter engagement.  2. Moderately elevated LVDEP.  3. Moderate LV dysfunction. EF estimated at 35-40% with anteroapical wall motion abnormality.   Plan: will admit to ICU. Aggressive medical therapy. Will consult CT surgery for consideration of CABG.   Echo 05/12/2019 IMPRESSIONS   1. Left ventricular ejection fraction, by visual estimation, is 35%. The left ventricle has moderately decreased function. Normal left ventricular size. There is mildly increased left ventricular hypertrophy. 2. Multiple segmental abnormalities exist. See findings. 3. Definity contrast agent was given IV to delineate the left ventricular endocardial borders. There is loosely organized thrombus at the apex. 4. Global right ventricle has normal systolic function.The right ventricular size is normal. No  increase in right ventricular wall thickness. 5. Left atrial size was normal. 6. Right atrial size was normal. 7. The mitral valve is grossly normal. Trace mitral valve regurgitation. 8. The tricuspid valve is grossly normal. Tricuspid valve regurgitation is trivial. 9. The pulmonic valve was grossly normal. Pulmonic valve regurgitation is not visualized by color flow Doppler. 10. The inferior vena cava is normal in size with greater than 50% respiratory variability, suggesting right atrial pressure of 3 mmHg. 11. The aortic valve was not well visualized Aortic valve regurgitation was not visualized by color flow Doppler. 12. TR signal is inadequate for assessing pulmonary artery systolic pressure.  Echo 08/26/19: IMPRESSIONS    1. Left ventricular ejection fraction, by visual estimation, is 40 to 45%. The left ventricle has mild to moderately decreased function. Left ventricular septal wall thickness was normal. There is  mildly increased left ventricular hypertrophy.  2. Mildly dilated left ventricular internal cavity size.  3. The left ventricle demonstrates regional wall motion abnormalities.  4. Septal and apical hypokinesis no mural apical thrombus.  5. Global right ventricle has normal systolic function.The right ventricular size is normal. No increase in right ventricular wall thickness.  6. Left atrial size was normal.  7. Right atrial size was normal.  8. The mitral valve is normal in structure. Trivial mitral valve regurgitation.  9. The tricuspid valve is normal in structure. 10. The aortic valve is tricuspid. Aortic valve regurgitation is not visualized. Mild aortic valve sclerosis without stenosis. 11. The pulmonic valve was grossly normal. Pulmonic valve regurgitation is trivial.  Labs/Other Tests and Data Reviewed:    EKG:  No Ecg today  Recent Labs: 05/11/2019: B Natriuretic Peptide 265.6; TSH 0.941 05/16/2019: ALT 20 05/18/2019: Magnesium 2.2 05/28/2019: BUN 15; Creatinine, Ser 0.71; Hemoglobin 10.4; Platelets 784; Potassium 5.3; Sodium 141   Recent Lipid Panel Lab Results  Component Value Date/Time   CHOL 93 05/11/2019 01:37 PM   TRIG 70 05/11/2019 01:37 PM   HDL 25 (L) 05/11/2019 01:37 PM   CHOLHDL 3.7 05/11/2019 01:37 PM   LDLCALC 54 05/11/2019 01:37 PM    Wt Readings from Last 3 Encounters:  08/28/19 145 lb (65.8 kg)  07/08/19 140 lb (63.5 kg)  06/11/19 142 lb (64.4 kg)     Objective:    Vital Signs:  BP 114/62   Pulse 83   Ht 5\' 4"  (1.626 m)   Wt 145 lb (65.8 kg)   BMI 24.89 kg/m    VITAL SIGNS:  reviewed  ASSESSMENT & PLAN:    1. Coronary artery disease status post CABG x3 October 2020:   Continue aspirin and Plavix for one year then stop Plavix. No restrictions now.  Continue Statin and beta blocker.  2. Ischemic cardiomyopathy: Tolerating low-dose carvedilol.  Recommend  increase lisinopril to 5 mg daily. Echo showed improvement in EF to 40-45%. She is  asymptomatic.   3. Hypertension: Blood pressure is controlled.   4.   Hyperlipidemia: will update chemistries and lipid panel.   COVID-19 Education: The signs and symptoms of COVID-19 were discussed with the patient and how to seek care for testing (follow up with PCP or arrange E-visit).  The importance of social distancing was discussed today.  Time:   Today, I have spent 15 minutes with the patient with telehealth technology discussing the above problems.     Medication Adjustments/Labs and Tests Ordered: Current medicines are reviewed at length with the patient today.  Concerns regarding medicines are outlined above.  Tests Ordered: No orders of the defined types were placed in this encounter.   Medication Changes: No orders of the defined types were placed in this encounter.   Follow Up:  In Person in 6 months  Signed, Annette Bertelson [place], MD  08/28/2019 9:03 AM    [facility]

## 2019-08-26 ENCOUNTER — Other Ambulatory Visit: Payer: Self-pay

## 2019-08-26 ENCOUNTER — Ambulatory Visit (HOSPITAL_COMMUNITY): Payer: 59 | Attending: Cardiovascular Disease

## 2019-08-26 DIAGNOSIS — I255 Ischemic cardiomyopathy: Secondary | ICD-10-CM

## 2019-08-26 MED ORDER — PERFLUTREN LIPID MICROSPHERE
1.0000 mL | INTRAVENOUS | Status: AC | PRN
  Administered 2019-08-26: 2 mL via INTRAVENOUS

## 2019-08-28 ENCOUNTER — Telehealth (INDEPENDENT_AMBULATORY_CARE_PROVIDER_SITE_OTHER): Payer: 59 | Admitting: Cardiology

## 2019-08-28 ENCOUNTER — Ambulatory Visit: Payer: 59 | Admitting: Cardiology

## 2019-08-28 ENCOUNTER — Encounter: Payer: Self-pay | Admitting: Cardiology

## 2019-08-28 VITALS — BP 114/62 | HR 83 | Ht 64.0 in | Wt 145.0 lb

## 2019-08-28 DIAGNOSIS — I2510 Atherosclerotic heart disease of native coronary artery without angina pectoris: Secondary | ICD-10-CM

## 2019-08-28 DIAGNOSIS — I10 Essential (primary) hypertension: Secondary | ICD-10-CM

## 2019-08-28 DIAGNOSIS — E785 Hyperlipidemia, unspecified: Secondary | ICD-10-CM

## 2019-08-28 DIAGNOSIS — I255 Ischemic cardiomyopathy: Secondary | ICD-10-CM

## 2019-08-28 MED ORDER — LISINOPRIL 5 MG PO TABS: 5.0000 mg | ORAL_TABLET | Freq: Every day | ORAL | 3 refills | Status: DC

## 2019-08-28 NOTE — Patient Instructions (Addendum)
Increase lisinopril to 5 mg daily  We will arrange for you to have lab work in [place].  Follow up in 6 months.  Call in April to schedule July appointment.

## 2019-08-28 NOTE — Addendum Note (Signed)
Addended by: Neoma Laming on: 08/28/2019 09:20 AM   Modules accepted: Orders

## 2019-09-17 ENCOUNTER — Other Ambulatory Visit: Payer: Self-pay | Admitting: Physician Assistant

## 2019-09-19 ENCOUNTER — Other Ambulatory Visit: Payer: Self-pay | Admitting: Physician Assistant

## 2019-09-20 ENCOUNTER — Telehealth: Payer: Self-pay | Admitting: Cardiology

## 2019-09-20 MED ORDER — CLOPIDOGREL BISULFATE 75 MG PO TABS: 75.0000 mg | ORAL_TABLET | Freq: Every day | ORAL | 3 refills | Status: DC

## 2019-09-20 MED ORDER — ATORVASTATIN CALCIUM 80 MG PO TABS: 80.0000 mg | ORAL_TABLET | Freq: Every day | ORAL | 3 refills | Status: DC

## 2019-09-20 NOTE — Telephone Encounter (Signed)
*  STAT* If patient is at the pharmacy, call can be transferred to refill team.   1. Which medications need to be refilled? (please list name of each medication and dose if known)  clopidogrel (PLAVIX) 75 MG tablet atorvastatin (LIPITOR) 80 MG tablet  2. Which pharmacy/location (including street and city if local pharmacy) is medication to be sent to? [facility] - [place], [place] - [address]  3. Do they need a 30 day or 90 day supply? 90 day supply

## 2019-09-20 NOTE — Telephone Encounter (Signed)
Medications refilled per request.

## 2019-11-22 ENCOUNTER — Other Ambulatory Visit: Payer: Self-pay

## 2019-11-22 MED ORDER — CARVEDILOL 6.25 MG PO TABS: 6.2500 mg | ORAL_TABLET | Freq: Two times a day (BID) | ORAL | 3 refills | Status: DC

## 2019-11-22 MED ORDER — ATORVASTATIN CALCIUM 80 MG PO TABS: 80.0000 mg | ORAL_TABLET | Freq: Every day | ORAL | 3 refills | Status: DC

## 2020-01-24 ENCOUNTER — Other Ambulatory Visit: Payer: Self-pay

## 2020-01-24 MED ORDER — CLOPIDOGREL BISULFATE 75 MG PO TABS: 75.0000 mg | ORAL_TABLET | Freq: Every day | ORAL | 3 refills | Status: DC

## 2020-01-26 ENCOUNTER — Other Ambulatory Visit: Payer: Self-pay | Admitting: Cardiology

## 2020-02-21 ENCOUNTER — Telehealth: Payer: Self-pay | Admitting: Cardiology

## 2020-02-21 NOTE — Telephone Encounter (Signed)
LVM for patient to return call to get follow scheduled with [place] from recall list

## 2020-03-30 ENCOUNTER — Other Ambulatory Visit: Payer: Self-pay | Admitting: Cardiology

## 2020-04-28 NOTE — Progress Notes (Signed)
Cardiology Office Note   Date:  05/01/2020   ID:  Maria Zuniga, DOB May 08, 1966, MRN [id number]  PCP:  Charlott RakesHodges, Francisco, MD  Cardiologist:   Ulyana Pitones Sw[place]Jordan, MD   Chief Complaint  Patient presents with   Coronary Artery Disease   Congestive Heart Failure      History of Present Illness: Maria Zuniga is a 54 y.o. female who presents for follow up CAD and CHF. She has a with PMH of HTN, HLD, former tobacco use, and  CAD.She presented to the hospital on 05/11/2019 was intermittent chest pain for 2 weeks. Initial EKG showed ST elevation in lead V2 and V3 with septal Q waves. Troponin was greater than 27,000. She was taken urgently for cardiac catheterization that showed 70% ostial left main lesion, 95% ostial LAD lesion, 60% ostial left circumflex lesion, 45% proximal RCA lesion, EF 35 to 45%. CT surgery was consulted. Echocardiogram obtained on 05/12/2019 showed EF 35%, loosely organized thrombus at the apex of LV.Limited echocardiogram obtained on 05/16/2019 showed EF 25- 30% withswirling of Definity contrast at the apex, possibly early forming thrombus in LV apex in some views. The study was personally reviewed and I felt there was no convincing evidence of LV thrombus, there was a myocardial band near the apex. Patient eventually underwent CABG x3 with LIMA to LAD, SVG to OM, and SVG to ramus intermediate by Dr. Laury DeepGerhardton 05/17/2019.  She was placed on Coreg and low dose lisinopril.  Repeat Echo this week showed improvement in EF to 40-45%.   On follow up today she is doing Ok. No chest pain or SOB. Not smoking. She does complain of calf pain in the left leg with exertion.   Energy is good. Tolerating medication well. 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      Past Medical History:  Diagnosis Date   Hyperlipidemia    Hypertension    Thyroid disease     Past Surgical History:  Procedure Laterality Date   CORONARY ARTERY BYPASS GRAFT  N/A 05/17/2019   Procedure: CORONARY ARTERY BYPASS GRAFTING (CABG) x Three, using left internal mammary artery and right leg greater saphenous vein harvested endoscopically;  Surgeon: Delight OvensGerhardt, Edward B, MD;  [facility];  Service: Open Heart Surgery;  Laterality: N/A;   HIP SURGERY     KNEE SURGERY     LEFT HEART CATH AND CORONARY ANGIOGRAPHY N/A 05/11/2019   Procedure: LEFT HEART CATH AND CORONARY ANGIOGRAPHY;  Surgeon: SwazilandJordan, Creston Klas M, MD;  [facility];  Service: Cardiovascular;  Laterality: N/A;   TEE WITHOUT CARDIOVERSION N/A 05/17/2019   Procedure: TRANSESOPHAGEAL ECHOCARDIOGRAM (TEE);  Surgeon: Delight OvensGerhardt, Edward B, MD;  [facility];  Service: Open Heart Surgery;  Laterality: N/A;   TUBAL LIGATION       Current Outpatient Medications  Medication Sig Dispense Refill   acetaminophen (TYLENOL) 500 MG tablet Take 2 tablets (1,000 mg total) by mouth every 6 (six) hours as needed for mild pain or fever. 30 tablet 0   aspirin EC 81 MG EC tablet Take 1 tablet (81 mg total) by mouth daily.     atorvastatin (LIPITOR) 80 MG tablet TAKE 1 TABLET BY MOUTH ONCE DAILY AT 6 PM 30 tablet 4   carvedilol (COREG) 6.25 MG tablet Take 1 tablet (6.25 mg total) by mouth 2 (two) times daily. 180 tablet 3   clopidogrel (PLAVIX) 75 MG tablet Take 1 tablet (75 mg total) by  mouth daily. 30 tablet 3   ezetimibe (ZETIA) 10 MG tablet Take 10 mg by mouth daily.     furosemide (LASIX) 20 MG tablet Take 1 tablet (20 mg total) by mouth daily as needed for fluid or edema. 30 tablet 1   levothyroxine (SYNTHROID) 100 MCG tablet Take 100 mcg by mouth daily before breakfast.     lisinopril (ZESTRIL) 5 MG tablet Take 1 tablet (5 mg total) by mouth daily. 90 tablet 3   nitroGLYCERIN (NITROSTAT) 0.4 MG SL tablet Place 1 tablet (0.4 mg total) under the tongue every 5 (five) minutes as needed for chest pain. 90 tablet 3   oxyCODONE (OXY IR/ROXICODONE) 5 MG immediate release tablet Take 1-2  tablets (5-10 mg total) by mouth every 4 (four) hours as needed for severe pain. 30 tablet 0   potassium chloride SA (KLOR-CON) 20 MEQ tablet Take 20 meq daily as needed if takes Lasix 90 tablet 3   nitroGLYCERIN (NITROSTAT) 0.4 MG SL tablet Place 1 tablet (0.4 mg total) under the tongue every 5 (five) minutes as needed for chest pain. DO NOT USE NO MORE THAN 3 TABLETS (Patient not taking: Reported on 07/08/2019) 25 tablet 2   No current facility-administered medications for this visit.    Allergies:   Patient has no known allergies.    Social History:  The patient  reports that she has quit smoking. Her smoking use included cigarettes. She started smoking about 1 years ago. She has never used smokeless tobacco. She reports previous alcohol use. She reports that she does not use drugs.   Family History:  The patient's family history includes CAD in her mother; Heart attack in her brother; Heart disease in her maternal grandfather, maternal grandmother, and paternal grandmother.    ROS:  Please see the history of present illness.   Otherwise, review of systems are positive for none.   All other systems are reviewed and negative.    PHYSICAL EXAM: VS:  BP 128/72    Pulse (!) 58    Ht 5\' 4"  (1.626 m)    Wt 151 lb 3.2 oz (68.6 kg)    SpO2 97%    BMI 25.95 kg/m  , BMI Body mass index is 25.95 kg/m. GEN: Well nourished, well developed, in no acute distress  HEENT: normal  Neck: no JVD, carotid bruits, or masses Cardiac: RRR; no murmurs, rubs, or gallops,no edema. Reduced left pedal pulse.  Respiratory:  clear to auscultation bilaterally, normal work of breathing GI: soft, nontender, nondistended, + BS MS: no deformity or atrophy  Skin: warm and dry, no rash Neuro:  Strength and sensation are intact Psych: euthymic mood, full affect   EKG:  EKG is ordered today. The ekg ordered today demonstrates NSR with old anteroseptal MI. T wave inversion c/w lateral ischemia. Improved from last  year. I have personally reviewed and interpreted this study.    Recent Labs: 05/11/2019: B Natriuretic Peptide 265.6; TSH 0.941 05/16/2019: ALT 20 05/18/2019: Magnesium 2.2 05/28/2019: BUN 15; Creatinine, Ser 0.71; Hemoglobin 10.4; Platelets 784; Potassium 5.3; Sodium 141    Lipid Panel    Component Value Date/Time   CHOL 93 05/11/2019 1337   TRIG 70 05/11/2019 1337   HDL 25 (L) 05/11/2019 1337   CHOLHDL 3.7 05/11/2019 1337   VLDL 14 05/11/2019 1337   LDLCALC 54 05/11/2019 1337      Wt Readings from Last 3 Encounters:  05/01/20 151 lb 3.2 oz (68.6 kg)  08/28/19 145 lb (65.8 kg)  07/08/19 140 lb (63.5 kg)      Other studies Reviewed: Additional studies/ records that were reviewed today include:   Cath 05/11/2019  Ost LM to Dist LM lesion is 75% stenosed.  Ost LAD lesion is 95% stenosed.  Ost Cx lesion is 60% stenosed.  Prox RCA lesion is 35% stenosed.  There is moderate left ventricular systolic dysfunction.  LV end diastolic pressure is moderately elevated.  The left ventricular ejection fraction is 35-45% by visual estimate.  1. Severe left main and critical ostial LAD stenosis. Severe dampening of pressures with any catheter engagement.  2. Moderately elevated LVDEP.  3. Moderate LV dysfunction. EF estimated at 35-40% with anteroapical wall motion abnormality.   Plan: will admit to ICU. Aggressive medical therapy. Will consult CT surgery for consideration of CABG.  Echo 05/12/2019 IMPRESSIONS   1. Left ventricular ejection fraction, by visual estimation, is 35%. The left ventricle has moderately decreased function. Normal left ventricular size. There is mildly increased left ventricular hypertrophy. 2. Multiple segmental abnormalities exist. See findings. 3. Definity contrast agent was given IV to delineate the left ventricular endocardial borders. There is loosely organized thrombus at the apex. 4. Global right ventricle has normal systolic  function.The right ventricular size is normal. No increase in right ventricular wall thickness. 5. Left atrial size was normal. 6. Right atrial size was normal. 7. The mitral valve is grossly normal. Trace mitral valve regurgitation. 8. The tricuspid valve is grossly normal. Tricuspid valve regurgitation is trivial. 9. The pulmonic valve was grossly normal. Pulmonic valve regurgitation is not visualized by color flow Doppler. 10. The inferior vena cava is normal in size with greater than 50% respiratory variability, suggesting right atrial pressure of 3 mmHg. 11. The aortic valve was not well visualized Aortic valve regurgitation was not visualized by color flow Doppler. 12. TR signal is inadequate for assessing pulmonary artery systolic pressure.  Echo 08/26/19: IMPRESSIONS   1. Left ventricular ejection fraction, by visual estimation, is 40 to 45%. The left ventricle has mild to moderately decreased function. Left ventricular septal wall thickness was normal. There is mildly increased left ventricular hypertrophy. 2. Mildly dilated left ventricular internal cavity size. 3. The left ventricle demonstrates regional wall motion abnormalities. 4. Septal and apical hypokinesis no mural apical thrombus. 5. Global right ventricle has normal systolic function.The right ventricular size is normal. No increase in right ventricular wall thickness. 6. Left atrial size was normal. 7. Right atrial size was normal. 8. The mitral valve is normal in structure. Trivial mitral valve regurgitation. 9. The tricuspid valve is normal in structure. 10. The aortic valve is tricuspid. Aortic valve regurgitation is not visualized. Mild aortic valve sclerosis without stenosis. 11. The pulmonic valve was grossly normal. Pulmonic valve regurgitation is trivial.   ASSESSMENT AND PLAN:  1. Coronary artery disease status post CABG x3 October 2020:   Continue aspirin and Plavix given PAD as well.     Continue Statin and beta blocker.  2. Ischemic cardiomyopathy: on Coreg and lisinopril.  Echo showed improvement in EF to 40-45%. She is asymptomatic. euvolemic.   3. Hypertension: Blood pressure is controlled.   4.   Hyperlipidemia: will update chemistries and lipid panel. on high dose statin and Zetia  5. PAD with left leg claudication. Will update dopplers. Had moderate flow impairment on pre CABG dopplers.   6. Carotid arterial disease. Will update dopplers.    Current medicines are reviewed at length with the patient today.  The patient does not  have concerns regarding medicines.  The following changes have been made:  no change  Labs/ tests ordered today include:   Orders Placed This Encounter  Procedures   Basic metabolic panel   Lipid panel   Hepatic function panel   CBC w/Diff/Platelet   TSH   VAS US CAROTID   VAS [place] LOWER EXTREMITY ARTERIAL DUPLEX     Disposition:   FU with me in 6 months  Signed, Fumiye Lubben [place], MD  05/01/2020 3:48 PM    [facility] [address], [place], [place], [address] Phone [phone], Fax [phone]

## 2020-05-01 ENCOUNTER — Other Ambulatory Visit: Payer: Self-pay

## 2020-05-01 ENCOUNTER — Encounter: Payer: Self-pay | Admitting: Cardiology

## 2020-05-01 ENCOUNTER — Ambulatory Visit: Payer: 59 | Admitting: Cardiology

## 2020-05-01 VITALS — BP 128/72 | HR 58 | Ht 64.0 in | Wt 151.2 lb

## 2020-05-01 DIAGNOSIS — I739 Peripheral vascular disease, unspecified: Secondary | ICD-10-CM

## 2020-05-01 DIAGNOSIS — E785 Hyperlipidemia, unspecified: Secondary | ICD-10-CM

## 2020-05-01 DIAGNOSIS — I255 Ischemic cardiomyopathy: Secondary | ICD-10-CM

## 2020-05-01 DIAGNOSIS — I6522 Occlusion and stenosis of left carotid artery: Secondary | ICD-10-CM

## 2020-05-01 DIAGNOSIS — I2510 Atherosclerotic heart disease of native coronary artery without angina pectoris: Secondary | ICD-10-CM

## 2020-05-01 DIAGNOSIS — I10 Essential (primary) hypertension: Secondary | ICD-10-CM | POA: Diagnosis not present

## 2020-05-01 NOTE — Addendum Note (Signed)
Addended by: Neoma Laming on: 05/01/2020 04:02 PM   Modules accepted: Orders

## 2020-05-01 NOTE — Patient Instructions (Signed)
Medication Instructions:  Continue same medications *If you need a refill on your cardiac medications before your next appointment, please call your pharmacy*   Lab Work: Bmet,lipid and hepatic panels,cbc,tsh today   Testing/Procedures: Carotids Lower Ext Arterial Dopplers    Follow-Up: At [facility], you and your health needs are our priority.  As part of our continuing mission to provide you with exceptional heart care, we have created designated Provider Care Teams.  These Care Teams include your primary Cardiologist (physician) and Advanced Practice Providers (APPs -  Physician Assistants and Nurse Practitioners) who all work together to provide you with the care you need, when you need it.  We recommend signing up for the patient portal called "MyChart".  Sign up information is provided on this After Visit Summary.  MyChart is used to connect with patients for Virtual Visits (Telemedicine).  Patients are able to view lab/test results, encounter notes, upcoming appointments, etc.  Non-urgent messages can be sent to your provider as well.   To learn more about what you can do with MyChart, go to [email or web address].    Your next appointment:  6 months  10/2020   The format for your next appointment: Office   Provider:  Dr.Jordan

## 2020-05-05 ENCOUNTER — Other Ambulatory Visit: Payer: Self-pay | Admitting: Cardiology

## 2020-05-05 ENCOUNTER — Telehealth: Payer: Self-pay | Admitting: Cardiology

## 2020-05-05 NOTE — Telephone Encounter (Signed)
Orders faxed, patient made aware.

## 2020-05-05 NOTE — Telephone Encounter (Signed)
Patient is requesting to have all orders for lab work faxed to [facility] at [phone].

## 2020-05-06 ENCOUNTER — Other Ambulatory Visit: Payer: Self-pay | Admitting: Cardiology

## 2020-05-06 LAB — CBC WITH DIFFERENTIAL/PLATELET
Basophils Absolute: 0 10*3/uL (ref 0.0–0.2)
Basos: 0 %
EOS (ABSOLUTE): 0 10*3/uL (ref 0.0–0.4)
Eos: 0 %
Hematocrit: 43.2 % (ref 34.0–46.6)
Hemoglobin: 14.9 g/dL (ref 11.1–15.9)
Immature Grans (Abs): 0 10*3/uL (ref 0.0–0.1)
Immature Granulocytes: 0 %
Lymphocytes Absolute: 3.3 10*3/uL — ABNORMAL HIGH (ref 0.7–3.1)
Lymphs: 42 %
MCH: 32.1 pg (ref 26.6–33.0)
MCHC: 34.5 g/dL (ref 31.5–35.7)
MCV: 93 fL (ref 79–97)
Monocytes Absolute: 0.5 10*3/uL (ref 0.1–0.9)
Monocytes: 6 %
Neutrophils Absolute: 4 10*3/uL (ref 1.4–7.0)
Neutrophils: 52 %
Platelets: 274 10*3/uL (ref 150–450)
RBC: 4.64 x10E6/uL (ref 3.77–5.28)
RDW: 13.9 % (ref 11.7–15.4)
WBC: 7.9 10*3/uL (ref 3.4–10.8)

## 2020-05-06 LAB — LIPID PANEL W/O CHOL/HDL RATIO
Cholesterol, Total: 163 mg/dL (ref 100–199)
HDL: 35 mg/dL — ABNORMAL LOW (ref >39)
LDL Chol Calc (NIH): 106 mg/dL — ABNORMAL HIGH (ref 0–99)
Triglycerides: 120 mg/dL (ref 0–149)
VLDL Cholesterol Cal: 22 mg/dL (ref 5–40)

## 2020-05-06 LAB — HEPATIC FUNCTION PANEL (6)
ALT: 50 IU/L — ABNORMAL HIGH (ref 0–32)
AST: 30 IU/L (ref 0–40)
Albumin: 4.4 g/dL (ref 3.8–4.9)
Alkaline Phosphatase: 88 IU/L (ref 44–121)
Bilirubin Total: 0.4 mg/dL (ref 0.0–1.2)
Bilirubin, Direct: 0.1 mg/dL (ref 0.00–0.40)

## 2020-05-06 LAB — BASIC METABOLIC PANEL
BUN/Creatinine Ratio: 14 (ref 9–23)
BUN: 13 mg/dL (ref 6–24)
CO2: 24 mmol/L (ref 20–29)
Calcium: 9.4 mg/dL (ref 8.7–10.2)
Chloride: 104 mmol/L (ref 96–106)
Creatinine, Ser: 0.91 mg/dL (ref 0.57–1.00)
GFR calc Af Amer: 82 mL/min/{1.73_m2} (ref >59)
GFR calc non Af Amer: 71 mL/min/{1.73_m2} (ref >59)
Glucose: 113 mg/dL — ABNORMAL HIGH (ref 65–99)
Potassium: 4.2 mmol/L (ref 3.5–5.2)
Sodium: 140 mmol/L (ref 134–144)

## 2020-05-06 LAB — SPECIMEN STATUS REPORT

## 2020-05-06 LAB — TSH: TSH: 140 u[IU]/mL — ABNORMAL HIGH (ref 0.450–4.500)

## 2020-05-11 ENCOUNTER — Telehealth: Payer: Self-pay | Admitting: Cardiology

## 2020-05-11 DIAGNOSIS — E039 Hypothyroidism, unspecified: Secondary | ICD-10-CM

## 2020-05-11 NOTE — Telephone Encounter (Signed)
She should resume at 100 micrograms daily. Needs repeat TSH and free T4 in 2 months.  Bynum Mccullars [place] MD, [facility]

## 2020-05-11 NOTE — Telephone Encounter (Signed)
Patient returned lab results.

## 2020-05-11 NOTE — Telephone Encounter (Signed)
The following abnormalities are noted: Hgb is now normal. Glucose was slightly elevated. There is mild elevation of ALT. Normal AST. Other chemistries are OK. TSH is markedly elevated.  All other values are normal, stable or within acceptable limits. Medication changes / Follow up labs / Other changes or recommendations:  LDL is not at goal of < 70. This may be related in part to hypothyroidism. Is she taking her Synthroid? Was listed as taking 100 micrograms daily. If she is taking need to increase dose to 125 micrograms daily. Needs to follow up with primary care about this. Once thyroid levels are back to normal we need to repeat lipid panel.  Patient has been out of levothyroxine 2 weeks - states he PCP has not provided her a refill Is Dr. [place]and OK with refilling? When should she recheck labs? Should she resume at or ? [facility] in [place] is preferred pharmacy

## 2020-05-12 ENCOUNTER — Other Ambulatory Visit: Payer: Self-pay | Admitting: Cardiology

## 2020-05-12 DIAGNOSIS — I739 Peripheral vascular disease, unspecified: Secondary | ICD-10-CM

## 2020-05-12 DIAGNOSIS — I255 Ischemic cardiomyopathy: Secondary | ICD-10-CM

## 2020-05-12 DIAGNOSIS — I10 Essential (primary) hypertension: Secondary | ICD-10-CM

## 2020-05-12 DIAGNOSIS — I6522 Occlusion and stenosis of left carotid artery: Secondary | ICD-10-CM

## 2020-05-12 DIAGNOSIS — E785 Hyperlipidemia, unspecified: Secondary | ICD-10-CM

## 2020-05-12 DIAGNOSIS — I2510 Atherosclerotic heart disease of native coronary artery without angina pectoris: Secondary | ICD-10-CM

## 2020-05-12 MED ORDER — LEVOTHYROXINE SODIUM 100 MCG PO TABS: 100.0000 ug | ORAL_TABLET | Freq: Every day | ORAL | 0 refills | Status: DC

## 2020-05-12 NOTE — Telephone Encounter (Signed)
Patient called with MD advice on med. Rx(s) sent to pharmacy electronically. Advised she establish with new PCP as she is displeased with current one - states she has not followed up with him since after her CABG Provided her with names of PCP practices in [place], which she states she is close to geographically.  TSH/Free T4 lab work mailed

## 2020-05-26 ENCOUNTER — Ambulatory Visit (HOSPITAL_COMMUNITY): Payer: 59

## 2020-05-26 ENCOUNTER — Ambulatory Visit (HOSPITAL_COMMUNITY): Admission: RE | Admit: 2020-05-26 | Payer: 59 | Source: Ambulatory Visit | Attending: Cardiology | Admitting: Cardiology

## 2020-06-05 ENCOUNTER — Other Ambulatory Visit: Payer: Self-pay

## 2020-06-05 MED ORDER — LEVOTHYROXINE SODIUM 125 MCG PO TABS: 125.0000 ug | ORAL_TABLET | Freq: Every day | ORAL | 3 refills | Status: DC

## 2020-06-12 ENCOUNTER — Ambulatory Visit (HOSPITAL_COMMUNITY)
Admission: RE | Admit: 2020-06-12 | Discharge: 2020-06-12 | Disposition: A | Discharge status: Home / Self Care | Payer: 59 | Source: Ambulatory Visit | Attending: Internal Medicine | Admitting: Internal Medicine

## 2020-06-12 ENCOUNTER — Ambulatory Visit (HOSPITAL_BASED_OUTPATIENT_CLINIC_OR_DEPARTMENT_OTHER)
Admission: RE | Admit: 2020-06-12 | Discharge: 2020-06-12 | Disposition: A | Discharge status: Home / Self Care | Payer: 59 | Source: Ambulatory Visit | Attending: Internal Medicine | Admitting: Internal Medicine

## 2020-06-12 ENCOUNTER — Other Ambulatory Visit: Payer: Self-pay

## 2020-06-12 DIAGNOSIS — I2510 Atherosclerotic heart disease of native coronary artery without angina pectoris: Secondary | ICD-10-CM

## 2020-06-12 DIAGNOSIS — I739 Peripheral vascular disease, unspecified: Secondary | ICD-10-CM

## 2020-06-12 DIAGNOSIS — E785 Hyperlipidemia, unspecified: Secondary | ICD-10-CM

## 2020-06-12 DIAGNOSIS — I6522 Occlusion and stenosis of left carotid artery: Secondary | ICD-10-CM | POA: Insufficient documentation

## 2020-06-12 DIAGNOSIS — I10 Essential (primary) hypertension: Secondary | ICD-10-CM | POA: Insufficient documentation

## 2020-06-12 DIAGNOSIS — I255 Ischemic cardiomyopathy: Secondary | ICD-10-CM | POA: Diagnosis present

## 2020-06-14 ENCOUNTER — Other Ambulatory Visit: Payer: Self-pay | Admitting: Physician Assistant

## 2020-06-15 ENCOUNTER — Other Ambulatory Visit: Payer: Self-pay

## 2020-06-15 DIAGNOSIS — I6522 Occlusion and stenosis of left carotid artery: Secondary | ICD-10-CM

## 2020-06-27 ENCOUNTER — Other Ambulatory Visit: Payer: Self-pay | Admitting: Cardiology

## 2020-07-07 ENCOUNTER — Ambulatory Visit: Payer: 59 | Admitting: Cardiovascular Disease

## 2020-07-07 ENCOUNTER — Other Ambulatory Visit: Payer: Self-pay

## 2020-07-07 ENCOUNTER — Encounter: Payer: Self-pay | Admitting: Cardiovascular Disease

## 2020-07-07 DIAGNOSIS — I739 Peripheral vascular disease, unspecified: Secondary | ICD-10-CM

## 2020-07-07 HISTORY — DX: Peripheral vascular disease, unspecified: I73.9

## 2020-07-07 MED ORDER — CILOSTAZOL 50 MG PO TABS: 50.0000 mg | ORAL_TABLET | Freq: Two times a day (BID) | ORAL | 3 refills | Status: DC

## 2020-07-07 NOTE — Patient Instructions (Addendum)
Medication Instructions:  ? ?-Start taking cilostazol (pletal) 50mg twice daily. ? ?*If you need a refill on your cardiac medications before your next appointment, please call your pharmacy* ? ? ?Follow-Up: ?At [facility], you and your health needs are our priority.  As part of our continuing mission to provide you with exceptional heart care, we have created designated Provider Care Teams.  These Care Teams include your primary Cardiologist (physician) and Advanced Practice Providers (APPs -  Physician Assistants and Nurse Practitioners) who all work together to provide you with the care you need, when you need it. ? ?We recommend signing up for the patient portal called "MyChart".  Sign up information is provided on this After Visit Summary.  MyChart is used to connect with patients for Virtual Visits (Telemedicine).  Patients are able to view lab/test results, encounter notes, upcoming appointments, etc.  Non-urgent messages can be sent to your provider as well.   ?To learn more about what you can do with MyChart, go to [email or web address].   ? ?Your next appointment:   ?3 month(s) ? ?The format for your next appointment:   ?In Person ? ?Provider:   ?Jonathan Berry, MD ?

## 2020-07-07 NOTE — Assessment & Plan Note (Signed)
Maria Zuniga was referred by Dr. Sw[place]iland for evaluation treatment of symptomatic PAD.  She has had left calf claudication for approximately a year.  She does do a lot of hiking with her husband and symptomatic from this.  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  She had Doppler studies performed 06/12/2020 revealing a right ABI of 1.09 and a left of 0.63.  She had a moderately elevated signal in her mid right SFA and an occluded left common iliac artery.  She wishes to delay intervention for several months.  In the interim, I am to begin her on Pletal 50 mg p.o. twice daily.

## 2020-07-07 NOTE — Progress Notes (Signed)
07/07/2020 Maria Zuniga   04-15-1966  [id number]  Primary Physician Charlott Rakes, MD Primary Cardiologist: Runell Gess MD Nicholes Calamity, [place]  HPI:  Maria Zuniga is a 54 y.o. thin appearing married Caucasian female mother of 2 children with no grandchildren accompanied by her husband Maria Zuniga today.  She was referred by Dr. Sw[place]iland, her cardiologist, for evaluation of symptomatic PAD.  She does have a history of 40 pack years of tobacco abuse having quit 3 years ago, treated hypertension and hyperlipidemia.  Her mother had CABG and her brother died of a myocardial infarction.  She had a myocardial infarction in the past which led to CABG x3 by Dr. Tyrone Sage 05/17/2019.  She does complain of left calf claudication and had Dopplers performed 06/12/2020 revealing a right ABI of 1.09 and a left of 0.63.  She did have a mildly elevated signal in her proximal right SFA although she is asymptomatic on that side and what appears to be an occluded left common iliac artery.  She wishes to delay endovascular therapy because of her husband's healing right shoulder.  In the interim, I am to begin her on Pletal 50 mg p.o. twice daily.   Current Meds  Medication Sig  . acetaminophen (TYLENOL) 500 MG tablet Take 2 tablets (1,000 mg total) by mouth every 6 (six) hours as needed for mild pain or fever.  Marland Kitchen aspirin EC 81 MG EC tablet Take 1 tablet (81 mg total) by mouth daily.  Marland Kitchen atorvastatin (LIPITOR) 80 MG tablet TAKE 1 TABLET BY MOUTH ONCE DAILY AT 6 PM  . carvedilol (COREG) 6.25 MG tablet Take 1 tablet by mouth twice daily  . clopidogrel (PLAVIX) 75 MG tablet Take 1 tablet by mouth once daily  . ezetimibe (ZETIA) 10 MG tablet Take 10 mg by mouth daily.  . furosemide (LASIX) 20 MG tablet Take 1 tablet (20 mg total) by mouth daily as needed for fluid or edema.  Marland Kitchen levothyroxine (SYNTHROID) 125 MCG tablet Take 1 tablet (125 mcg total) by mouth daily before breakfast.  . lisinopril (ZESTRIL) 5 MG  tablet Take 1 tablet (5 mg total) by mouth daily.  . nitroGLYCERIN (NITROSTAT) 0.4 MG SL tablet Place 1 tablet (0.4 mg total) under the tongue every 5 (five) minutes as needed for chest pain. DO NOT USE NO MORE THAN 3 TABLETS  . nitroGLYCERIN (NITROSTAT) 0.4 MG SL tablet Place 1 tablet (0.4 mg total) under the tongue every 5 (five) minutes as needed for chest pain.     No Known Allergies  Social History   Socioeconomic History  . Marital status: Married    Spouse name: Not on file  . Number of children: Not on file  . Years of education: Not on file  . Highest education level: Not on file  Occupational History  . Occupation: mail carrier  Tobacco Use  . Smoking status: Former Smoker    Types: Cigarettes    Start date: 05/10/2018  . Smokeless tobacco: Never Used  Vaping Use  . Vaping Use: Never used  Substance and Sexual Activity  . Alcohol use: Not Currently  . Drug use: Never  . Sexual activity: Not on file  Other Topics Concern  . Not on file  Social History Narrative  . Not on file   Social Determinants of Health   Financial Resource Strain:   . Difficulty of Paying Living Expenses: Not on file  Food Insecurity:   . Worried About Programme researcher, broadcasting/film/video  in the Last Year: Not on file  . Ran Out of Food in the Last Year: Not on file  Transportation Needs:   . Lack of Transportation (Medical): Not on file  . Lack of Transportation (Non-Medical): Not on file  Physical Activity:   . Days of Exercise per Week: Not on file  . Minutes of Exercise per Session: Not on file  Stress:   . Feeling of Stress : Not on file  Social Connections:   . Frequency of Communication with Friends and Family: Not on file  . Frequency of Social Gatherings with Friends and Family: Not on file  . Attends Religious Services: Not on file  . Active Member of Clubs or Organizations: Not on file  . Attends Banker Meetings: Not on file  . Marital Status: Not on file  Intimate Partner  Violence:   . Fear of Current or Ex-Partner: Not on file  . Emotionally Abused: Not on file  . Physically Abused: Not on file  . Sexually Abused: Not on file     Review of Systems: General: negative for chills, fever, night sweats or weight changes.  Cardiovascular: negative for chest pain, dyspnea on exertion, edema, orthopnea, palpitations, paroxysmal nocturnal dyspnea or shortness of breath Dermatological: negative for rash Respiratory: negative for cough or wheezing Urologic: negative for hematuria Abdominal: negative for nausea, vomiting, diarrhea, bright red blood per rectum, melena, or hematemesis Neurologic: negative for visual changes, syncope, or dizziness All other systems reviewed and are otherwise negative except as noted above.    Blood pressure (!) 102/58, pulse 72, height 5\' 4"  (1.626 m), weight 143 lb (64.9 kg).  General appearance: alert and no distress Neck: no adenopathy, no carotid bruit, no JVD, supple, symmetrical, trachea midline and thyroid not enlarged, symmetric, no tenderness/mass/nodules Lungs: clear to auscultation bilaterally Heart: regular rate and rhythm, S1, S2 normal, no murmur, click, rub or gallop Extremities: extremities normal, atraumatic, no cyanosis or edema Pulses: 2+ and symmetric Decreased left pedal pulse Skin: Skin color, texture, turgor normal. No rashes or lesions Neurologic: Alert and oriented X 3, normal strength and tone. Normal symmetric reflexes. Normal coordination and gait  EKG not performed today  ASSESSMENT AND PLAN:   Peripheral arterial disease (HCC) Maria Zuniga was referred by Dr. for evaluation treatment of symptomatic PAD.  She has had left calf claudication for approximately a year.  She does do a lot of hiking with her husband and symptomatic from this.  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  She had Doppler studies performed 06/12/2020 revealing a right ABI of 1.09 and a left of 0.63.  She had a moderately elevated signal in her  mid right SFA and an occluded left common iliac artery.  She wishes to delay intervention for several months.  In the interim, I am to begin her on Pletal 50 mg p.o. twice daily.      13/12/2019 MD FACP,FACC,FAHA, [facility] 07/07/2020 10:51 AM

## 2020-07-19 ENCOUNTER — Other Ambulatory Visit: Payer: Self-pay | Admitting: Cardiology

## 2020-08-02 ENCOUNTER — Other Ambulatory Visit: Payer: Self-pay | Admitting: Cardiology

## 2020-08-19 ENCOUNTER — Other Ambulatory Visit: Payer: Self-pay | Admitting: Cardiology

## 2020-08-24 ENCOUNTER — Other Ambulatory Visit: Payer: Self-pay | Admitting: Cardiology

## 2020-08-27 IMAGING — DX DG CHEST 1V PORT
1 series · 1 of 1 positions shown · non-contrast
Comparison: Preop chest x-ray 05/12/2019

CLINICAL DATA: Status post coronary artery bypass surgery today.

EXAM:
PORTABLE CHEST 1 VIEW

[chest]
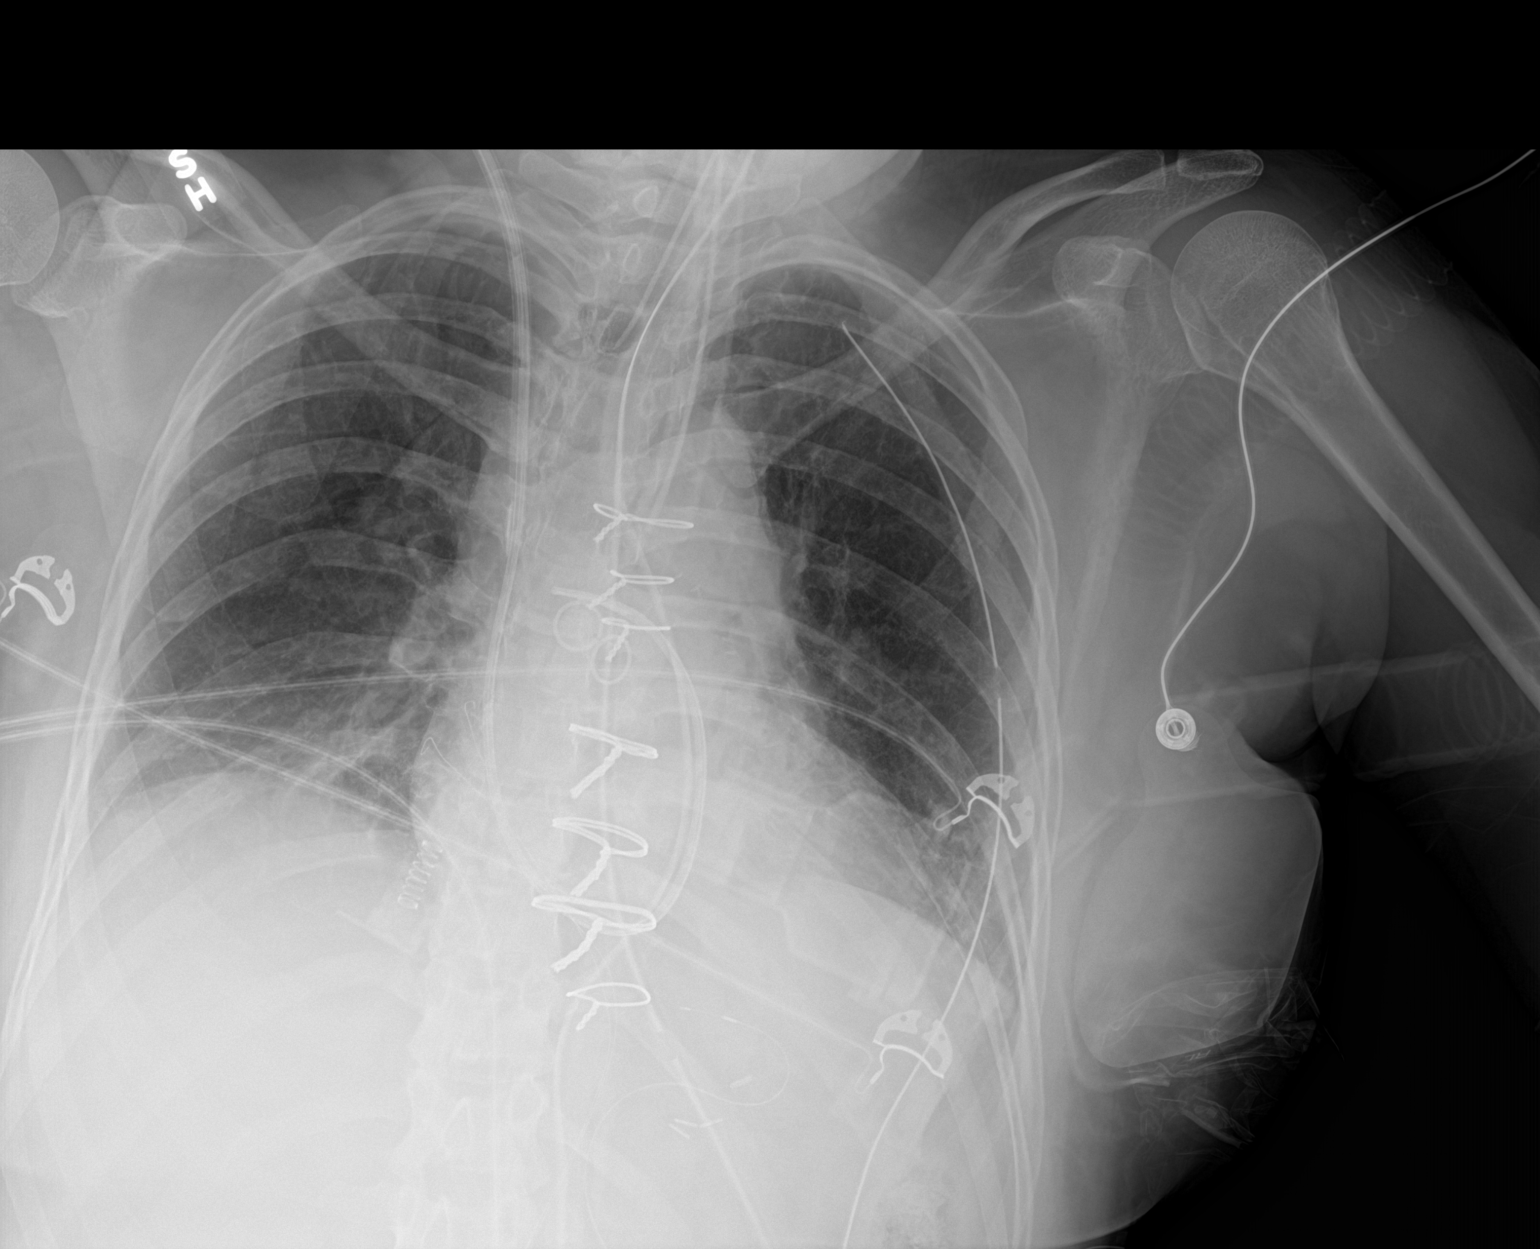

[1 of 1 positions shown; findings below may reference images not displayed]

FINDINGS: The endotracheal tube is 4.9 cm above the carina. The NG tube is
coursing down the esophagus and into the stomach.

The right IJ Swan-Ganz catheter tip is in the proximal right
pulmonary artery.

Epicardial pacer wires are noted.

Left-sided chest tube in good position.  No pneumothorax.

The cardiac silhouette, mediastinal and hilar contours are within
normal limits. There is perihilar and bibasilar atelectasis but no
edema or effusions.
IMPRESSION: 1. Postoperative support apparatus in good position without
complicating features.
2. No pneumothorax.
3. Perihilar and bibasilar atelectasis but no edema or effusions.

## 2020-08-28 IMAGING — DX DG CHEST 1V PORT
1 series · 1 of 1 positions shown · non-contrast
Comparison: 05/17/2019

CLINICAL DATA: CABG on [DATE].

EXAM:
PORTABLE CHEST 1 VIEW

[chest ap]
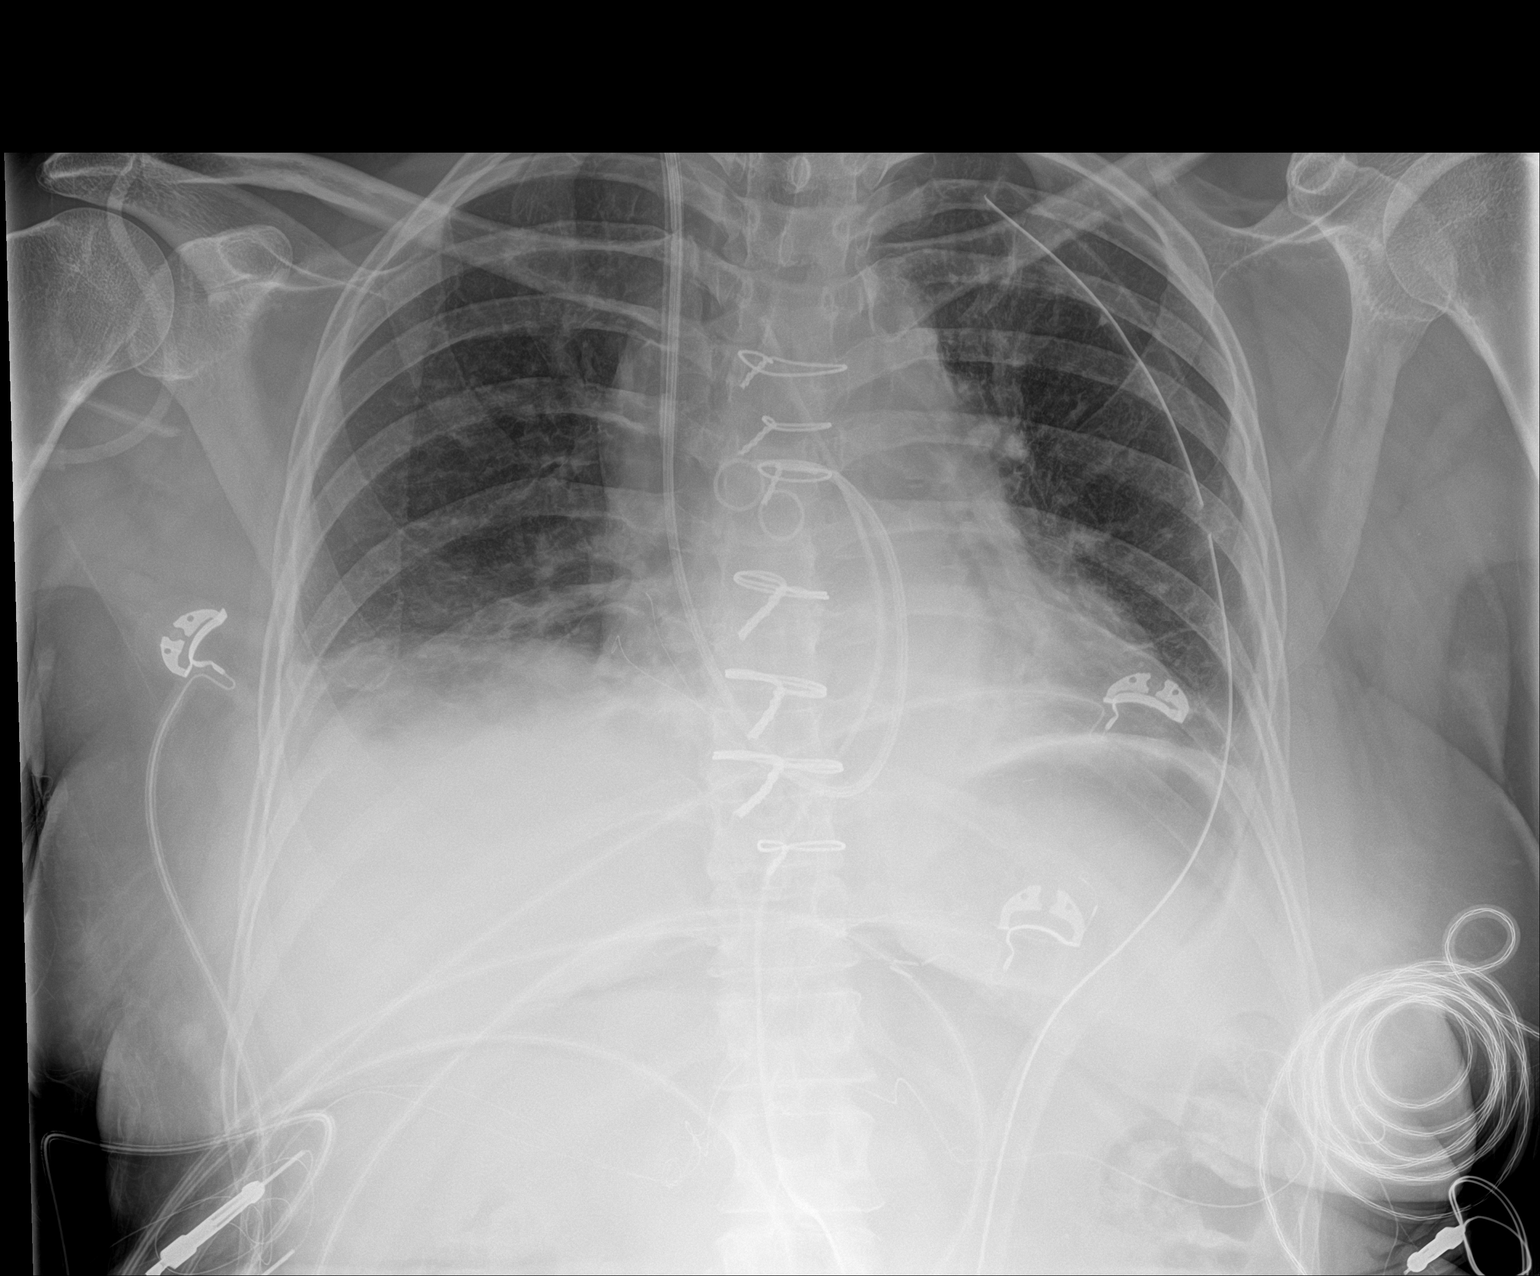

[1 of 1 positions shown; findings below may reference images not displayed]

FINDINGS: Extubation and removal of nasogastric tube. Right IJ Swan-Ganz
catheter tip at pulmonary outflow tract. Left chest tube and
mediastinal drain.

Patient rotated right. Normal heart size. Trace bilateral pleural
fluid. No pneumothorax. No congestive failure. Diminished lung
volumes with similar left and slight increase in right base
atelectasis. Median sternotomy.
IMPRESSION: 1. Interval extubation.
2. Trace bilateral pleural fluid with slight increase in right base
atelectasis.

## 2020-08-29 IMAGING — DX DG CHEST 1V PORT
1 series · 1 of 1 positions shown · non-contrast
Comparison: May 18, 2019.

CLINICAL DATA: Status post coronary bypass graft.

EXAM:
PORTABLE CHEST 1 VIEW

[chest]
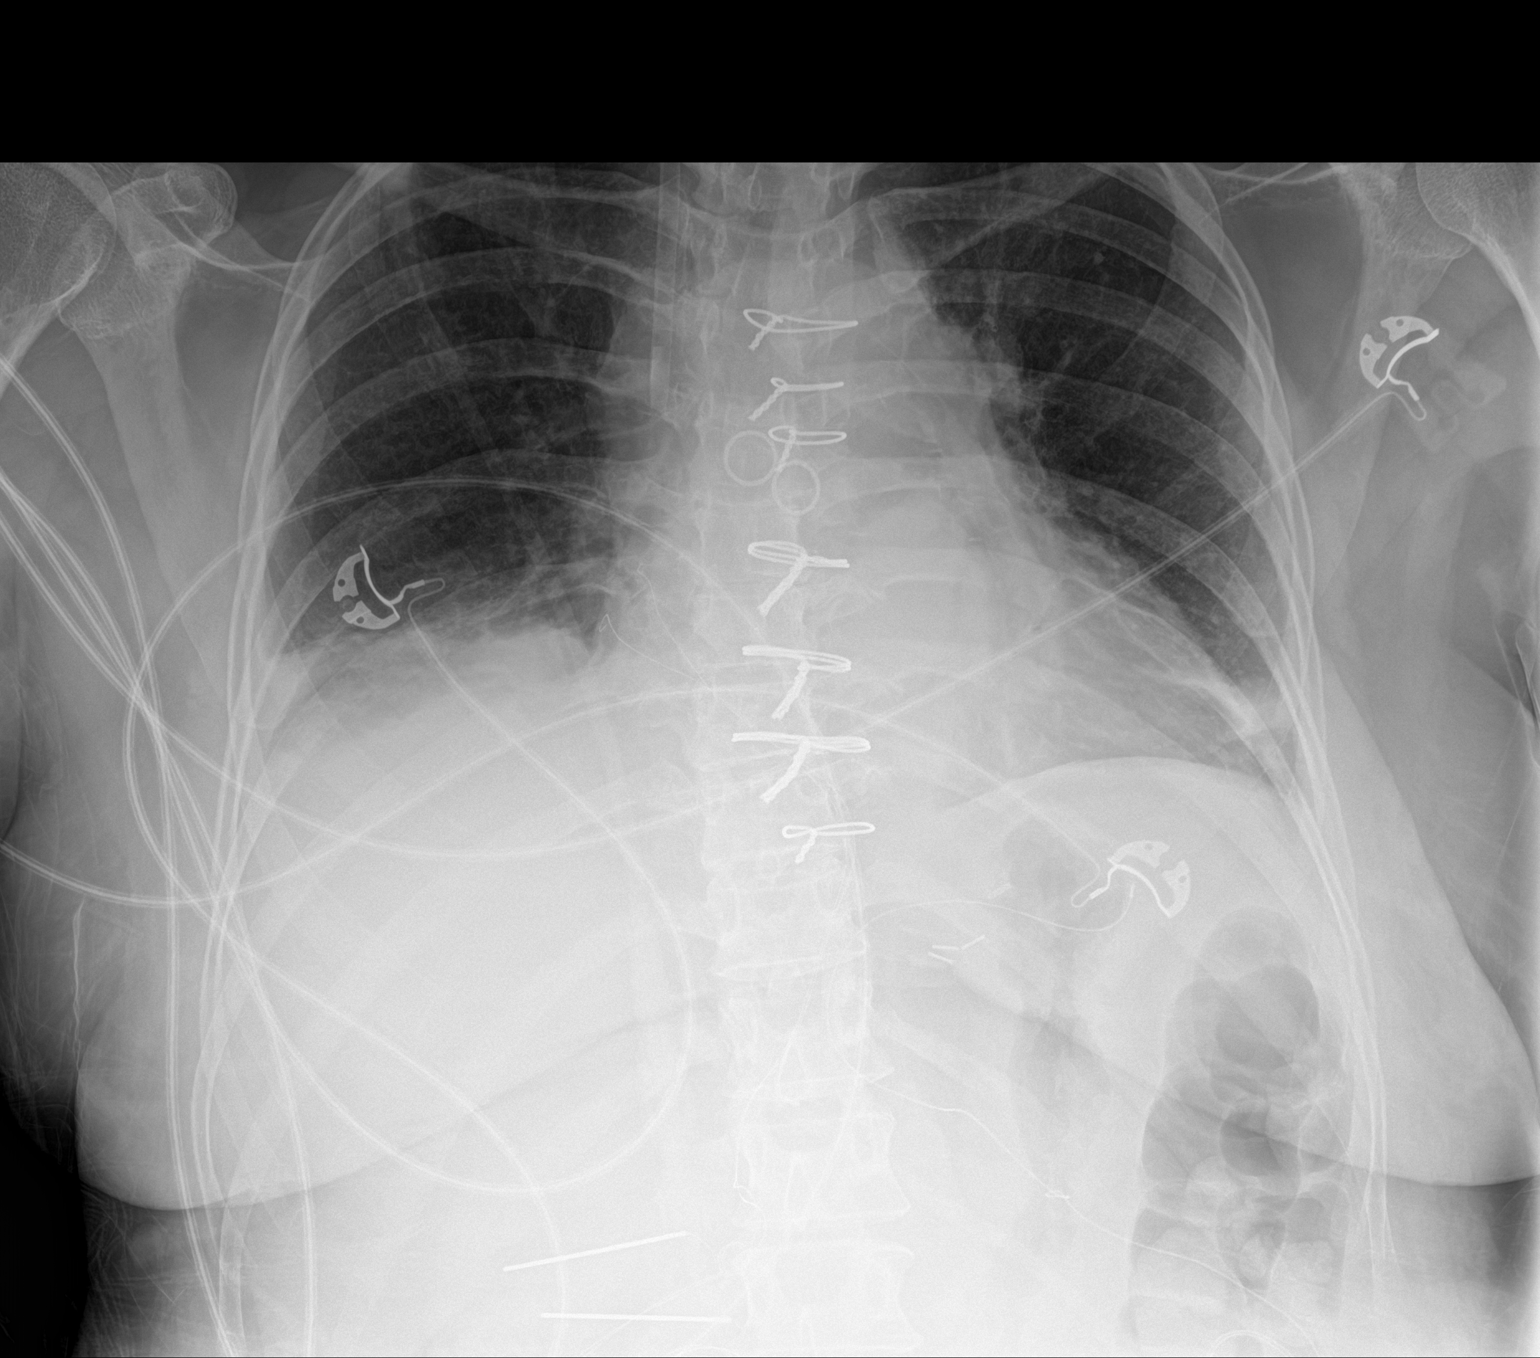

[1 of 1 positions shown; findings below may reference images not displayed]

FINDINGS: Stable cardiomediastinal silhouette. Left-sided chest tube is noted
without pneumothorax. Swan-Ganz catheter has been removed. Right
internal jugular venous sheath remains. Stable bibasilar
subsegmental atelectasis is noted, right greater than left. Small
right pleural effusion is noted. Bony thorax is unremarkable.
IMPRESSION: Stable bibasilar subsegmental atelectasis. Small right pleural
effusion is noted. No pneumothorax is noted status post left-sided
chest tube removal.

## 2020-08-30 IMAGING — DX DG CHEST 2V
2 series · 2 of 2 positions shown · non-contrast
Comparison: 05/19/2019, 05/18/2011

CLINICAL DATA: 53-year-old female with chest pain status post
cardiac surgery

EXAM:
CHEST - 2 VIEW

[w chest pa]
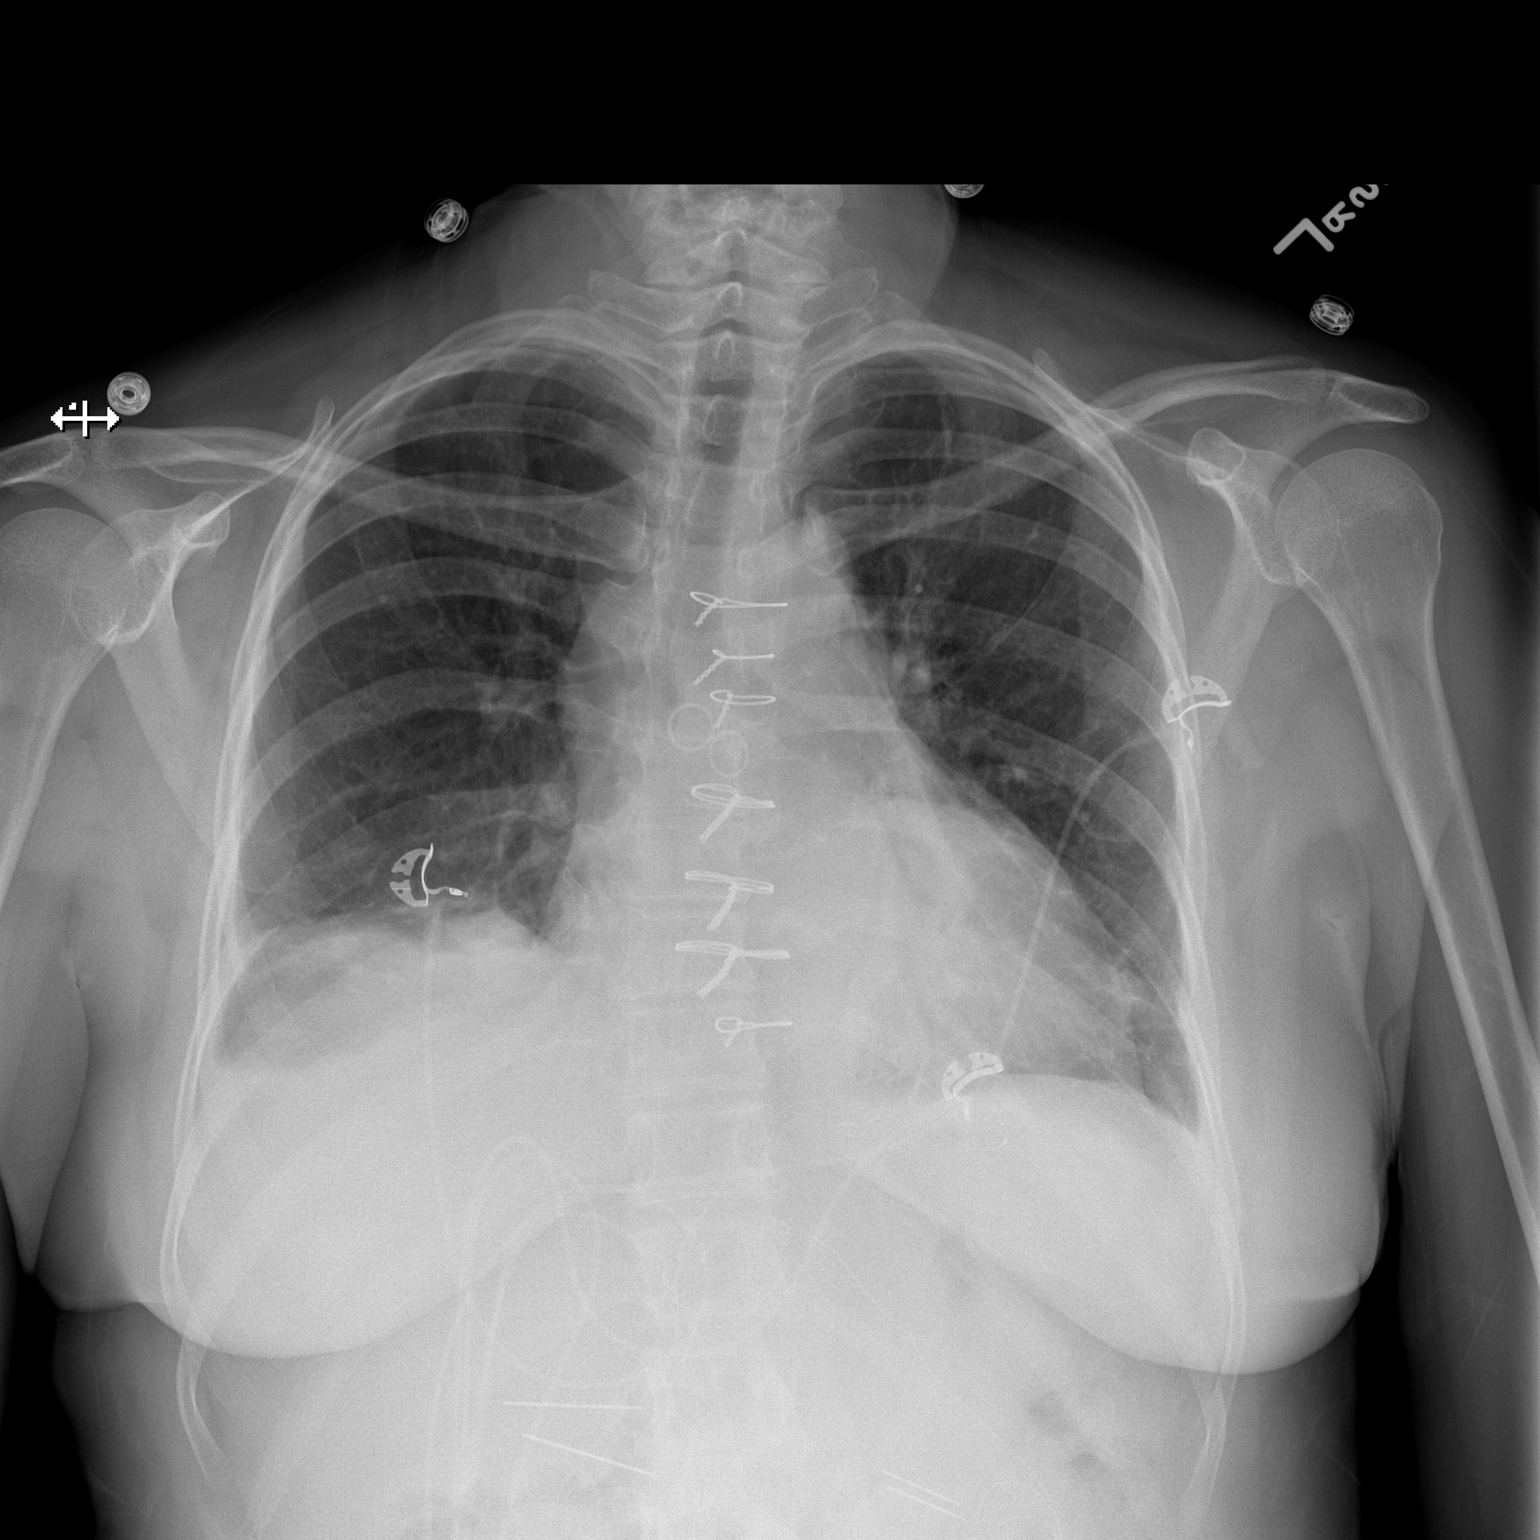

[w chest lat]
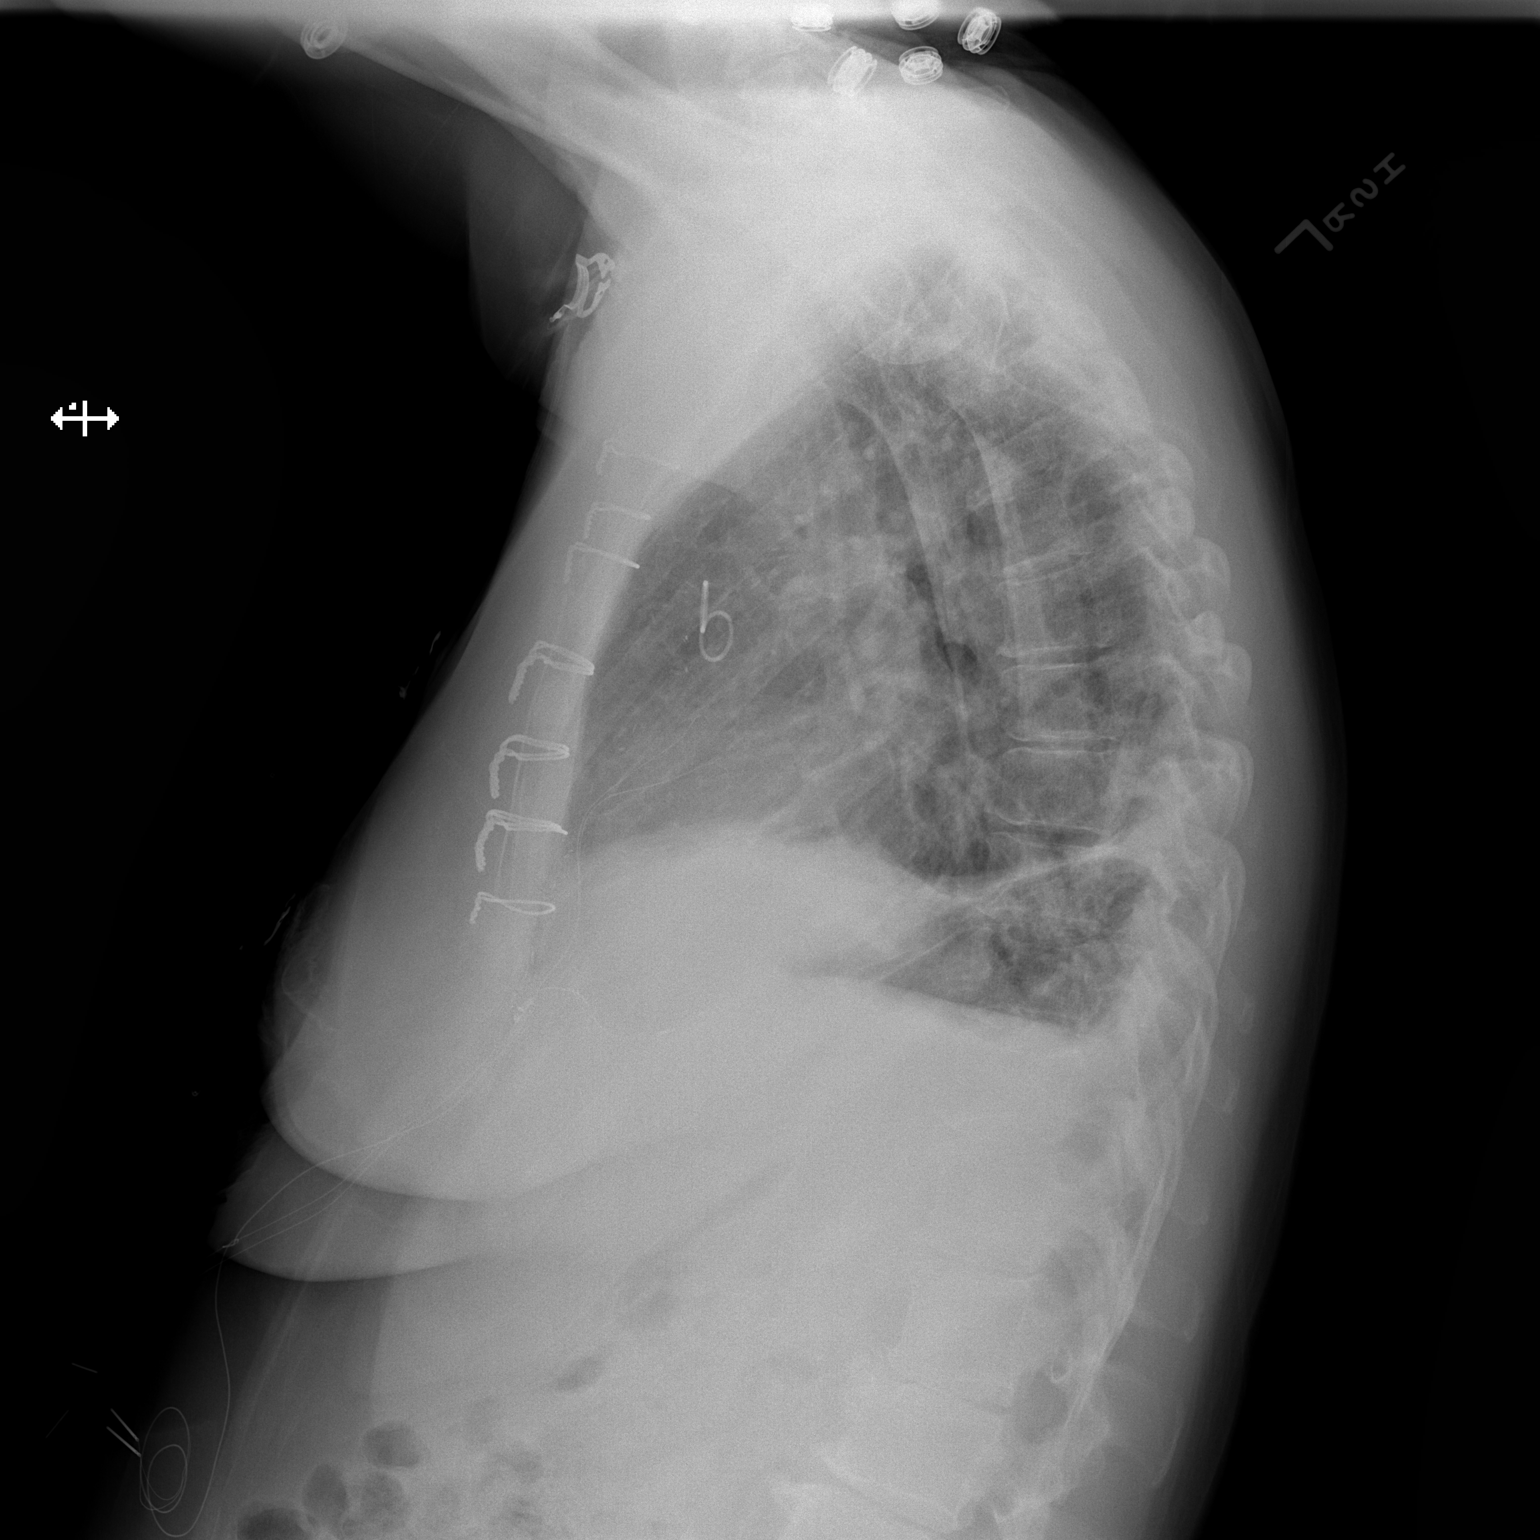

[2 of 2 positions shown; findings below may reference images not displayed]

FINDINGS: Cardiomediastinal silhouette unchanged in size and contour. Surgical
changes of median sternotomy and CABG. Interval removal of right IJ
sheath.

Epicardial pacing leads remain.

Improving aeration of the lungs with persisting blunting of the
right costophrenic angle and asymmetric elevation of the right
hemidiaphragm. Coarsened interstitial markings with no confluent
airspace disease.

No visualized pneumothorax.
IMPRESSION: Improving lung aeration with trace right-sided pleural fluid and/or
atelectasis.

Surgical changes of median sternotomy and CABG.

Interval removal of right IJ sheath, with epicardial pacing leads
remaining

## 2020-09-22 ENCOUNTER — Other Ambulatory Visit: Payer: Self-pay | Admitting: Cardiology

## 2020-09-28 ENCOUNTER — Telehealth: Payer: Self-pay | Admitting: Cardiology

## 2020-09-28 NOTE — Telephone Encounter (Signed)
*  STAT* If patient is at the pharmacy, call can be transferred to refill team.   1. Which medications need to be refilled? (please list name of each medication and dose if known) carvedilol and lisinopril.   2. Which pharmacy/location (including street and city if local pharmacy) is medication to be sent to? [facility]  3. Do they need a 30 day or 90 day supply? 30 day patient has two up coming apt's

## 2020-09-29 MED ORDER — LISINOPRIL 5 MG PO TABS: 5.0000 mg | ORAL_TABLET | Freq: Every day | ORAL | 3 refills | Status: DC

## 2020-09-29 MED ORDER — CARVEDILOL 6.25 MG PO TABS
6.2500 mg | ORAL_TABLET | Freq: Two times a day (BID) | ORAL | 3 refills | Status: DC
Start: 2020-09-29 — End: 2021-04-16

## 2020-10-06 ENCOUNTER — Ambulatory Visit: Payer: BLUE CROSS/BLUE SHIELD | Admitting: Cardiovascular Disease

## 2020-10-06 ENCOUNTER — Encounter: Payer: Self-pay | Admitting: Cardiovascular Disease

## 2020-10-06 ENCOUNTER — Other Ambulatory Visit: Payer: Self-pay

## 2020-10-06 VITALS — BP 132/78 | HR 66 | Ht 65.0 in | Wt 141.0 lb

## 2020-10-06 DIAGNOSIS — E785 Hyperlipidemia, unspecified: Secondary | ICD-10-CM | POA: Diagnosis not present

## 2020-10-06 DIAGNOSIS — I739 Peripheral vascular disease, unspecified: Secondary | ICD-10-CM | POA: Diagnosis not present

## 2020-10-06 DIAGNOSIS — E782 Mixed hyperlipidemia: Secondary | ICD-10-CM | POA: Diagnosis not present

## 2020-10-06 DIAGNOSIS — I2510 Atherosclerotic heart disease of native coronary artery without angina pectoris: Secondary | ICD-10-CM

## 2020-10-06 NOTE — Assessment & Plan Note (Signed)
History of PAD with ultrasound demonstrated left common iliac artery CTO with moderate proximal right SFA stenosis.  Since beginning on Pletal she really denies lifestyle limiting claudication although she does admit to not walking much given the weather.  At this point, there is no indication to perform endovascular therapy.  I will see her back in 6 months to revisit this.  I have told her that if she develops claudication in the interim I had be happy to see her and arrange angiography and intervention.

## 2020-10-06 NOTE — Assessment & Plan Note (Signed)
History of hyperlipidemia on high-dose atorvastatin and Zetia with lipid profile performed 04/27/2020 revealing total cholesterol of 163, LDL of 106 and HDL 35.  And can refer her to Dr. Rennis Golden for consideration of PCSK9 additional therapy.

## 2020-10-06 NOTE — Patient Instructions (Signed)
Medication Instructions:  Your physician recommends that you continue on your current medications as directed. Please refer to the Current Medication list given to you today.  *If you need a refill on your cardiac medications before your next appointment, please call your pharmacy*   Testing/Procedures: Your physician has requested that you have a lower extremity arterial duplex. This test is an ultrasound of the arteries in the legs. It looks at arterial blood flow in the legs. Allow one hour for Lower Arterial scans. There are no restrictions or special instructions  Your physician has requested that you have an ankle brachial index (ABI). During this test an ultrasound and blood pressure cuff are used to evaluate the arteries that supply the arms and legs with blood. Allow thirty minutes for this exam. There are no restrictions or special instructions.  These procedures are done at [address] [facility]. 2nd [address]. TO be done in Nov 2022.   Follow-Up: At [facility], you and your health needs are our priority.  As part of our continuing mission to provide you with exceptional heart care, we have created designated Provider Care Teams.  These Care Teams include your primary Cardiologist (physician) and Advanced Practice Providers (APPs -  Physician Assistants and Nurse Practitioners) who all work together to provide you with the care you need, when you need it.  We recommend signing up for the patient portal called "MyChart".  Sign up information is provided on this After Visit Summary.  MyChart is used to connect with patients for Virtual Visits (Telemedicine).  Patients are able to view lab/test results, encounter notes, upcoming appointments, etc.  Non-urgent messages can be sent to your provider as well.   To learn more about what you can do with MyChart, go to [email or web address].    Your next appointment:   6 month(s)  The format for your next appointment:   In Person  Provider:    Nanetta Batty, MD     Other Instructions  Heart-Healthy Eating Plan Heart-healthy meal planning includes:  Eating less unhealthy fats.  Eating more healthy fats.  Making other changes in your diet. Talk with your doctor or a diet specialist (dietitian) to create an eating plan that is right for you. What are tips for following this plan? Cooking Avoid frying your food. Try to bake, boil, grill, or broil it instead. You can also reduce fat by:  Removing the skin from poultry.  Removing all visible fats from meats.  Steaming vegetables in water or broth. Meal planning  At meals, divide your plate into four equal parts: ? Fill one-half of your plate with vegetables and green salads. ? Fill one-fourth of your plate with whole grains. ? Fill one-fourth of your plate with lean protein foods.  Eat 4-5 servings of vegetables per day. A serving of vegetables is: ? 1 cup of raw or cooked vegetables. ? 2 cups of raw leafy greens.  Eat 4-5 servings of fruit per day. A serving of fruit is: ? 1 medium whole fruit. ?  cup of dried fruit. ?  cup of fresh, frozen, or canned fruit. ?  cup of 100% fruit juice.  Eat more foods that have soluble fiber. These are apples, broccoli, carrots, beans, peas, and barley. Try to get 20-30 g of fiber per day.  Eat 4-5 servings of nuts, legumes, and seeds per week: ? 1 serving of dried beans or legumes equals  cup after being cooked. ? 1 serving of nuts is  cup. ?  1 serving of seeds equals 1 tablespoon.   General information  Eat more home-cooked food. Eat less restaurant, buffet, and fast food.  Limit or avoid alcohol.  Limit foods that are high in starch and sugar.  Avoid fried foods.  Lose weight if you are overweight.  Keep track of how much salt (sodium) you eat. This is important if you have high blood pressure. Ask your doctor to tell you more about this.  Try to add vegetarian meals each week. Fats  Choose healthy  fats. These include olive oil and canola oil, flaxseeds, walnuts, almonds, and seeds.  Eat more omega-3 fats. These include salmon, mackerel, sardines, tuna, flaxseed oil, and ground flaxseeds. Try to eat fish at least 2 times each week.  Check food labels. Avoid foods with trans fats or high amounts of saturated fat.  Limit saturated fats. ? These are often found in animal products, such as meats, butter, and cream. ? These are also found in plant foods, such as palm oil, palm kernel oil, and coconut oil.  Avoid foods with partially hydrogenated oils in them. These have trans fats. Examples are stick margarine, some tub margarines, cookies, crackers, and other baked goods. What foods can I eat? Fruits All fresh, canned (in natural juice), or frozen fruits. Vegetables Fresh or frozen vegetables (raw, steamed, roasted, or grilled). Green salads. Grains Most grains. Choose whole wheat and whole grains most of the time. Rice and pasta, including brown rice and pastas made with whole wheat. Meats and other proteins Lean, well-trimmed beef, veal, pork, and lamb. Chicken and [place] without skin. All fish and shellfish. Wild duck, rabbit, pheasant, and venison. Egg whites or low-cholesterol egg substitutes. Dried beans, peas, lentils, and tofu. Seeds and most nuts. Dairy Low-fat or nonfat cheeses, including ricotta and mozzarella. Skim or 1% milk that is liquid, powdered, or evaporated. Buttermilk that is made with low-fat milk. Nonfat or low-fat yogurt. Fats and oils Non-hydrogenated (trans-free) margarines. Vegetable oils, including soybean, sesame, sunflower, olive, peanut, safflower, corn, canola, and cottonseed. Salad dressings or mayonnaise made with a vegetable oil. Beverages Mineral water. Coffee and tea. Diet carbonated beverages. Sweets and desserts Sherbet, gelatin, and fruit ice. Small amounts of dark chocolate. Limit all sweets and desserts. Seasonings and condiments All  seasonings and condiments. The items listed above may not be a complete list of foods and drinks you can eat. Contact a dietitian for more options. What foods should I avoid? Fruits Canned fruit in heavy syrup. Fruit in cream or butter sauce. Fried fruit. Limit coconut. Vegetables Vegetables cooked in cheese, cream, or butter sauce. Fried vegetables. Grains Breads that are made with saturated or trans fats, oils, or whole milk. Croissants. Sweet rolls. Donuts. High-fat crackers, such as cheese crackers. Meats and other proteins Fatty meats, such as hot dogs, ribs, sausage, bacon, rib-eye roast or steak. High-fat deli meats, such as salami and bologna. Caviar. Domestic duck and goose. Organ meats, such as liver. Dairy Cream, sour cream, cream cheese, and creamed cottage cheese. Whole-milk cheeses. Whole or 2% milk that is liquid, evaporated, or condensed. Whole buttermilk. Cream sauce or high-fat cheese sauce. Yogurt that is made from whole milk. Fats and oils Meat fat, or shortening. Cocoa butter, hydrogenated oils, palm oil, coconut oil, palm kernel oil. Solid fats and shortenings, including bacon fat, salt pork, lard, and butter. Nondairy cream substitutes. Salad dressings with cheese or sour cream. Beverages Regular sodas and juice drinks with added sugar. Sweets and desserts Frosting. Pudding. Cookies. Cakes.  Pies. Milk chocolate or white chocolate. Buttered syrups. Full-fat ice cream or ice cream drinks. The items listed above may not be a complete list of foods and drinks to avoid. Contact a dietitian for more information. Summary  Heart-healthy meal planning includes eating less unhealthy fats, eating more healthy fats, and making other changes in your diet.  Eat a balanced diet. This includes fruits and vegetables, low-fat or nonfat dairy, lean protein, nuts and legumes, whole grains, and heart-healthy oils and fats. This information is not intended to replace advice given to you by  your health care provider. Make sure you discuss any questions you have with your health care provider. Document Revised: 09/28/2017 Document Reviewed: 09/01/2017 Elsevier Patient Education  2021 [employer].

## 2020-10-06 NOTE — Progress Notes (Signed)
10/06/2020 Maria Zuniga   08-Aug-1966  [id number]  Primary Physician Charlott Rakes, MD Primary Cardiologist: Runell Gess MD Nicholes Calamity, [place]  HPI:  Maria Zuniga is a 55 y.o.  thin appearing married Caucasian female mother of 2 children with no grandchildren   who was referred by Dr. [place], her cardiologist, for evaluation of symptomatic PAD.  I last saw her in the office 07/07/2020. She does have a history of 40 pack years of tobacco abuse having quit 3 years ago, treated hypertension and hyperlipidemia.  Her mother had CABG and her brother died of a myocardial infarction.  She had a myocardial infarction in the past which led to CABG x3 by Dr. Tyrone Sage 05/17/2019.  She does complain of left calf claudication and had Dopplers performed 06/12/2020 revealing a right ABI of 1.09 and a left of 0.63.  She did have a mildly elevated signal in her proximal right SFA although she is asymptomatic on that side and what appears to be an occluded left common iliac artery.  She wishes to delay endovascular therapy because of her husband's healing right shoulder.  In the interim, I am to begin her on Pletal 50 mg p.o. twice daily.  Since I saw her 3 months ago she no longer complains of claudication although she does admit to not walking much given the weather.  I did begin her on Pletal 50 mg p.o. twice daily which may have been beneficial.  She denies chest pain, or shortness of breath.   Current Meds  Medication Sig  . acetaminophen (TYLENOL) 500 MG tablet Take 2 tablets (1,000 mg total) by mouth every 6 (six) hours as needed for mild pain or fever.  Marland Kitchen aspirin EC 81 MG EC tablet Take 1 tablet (81 mg total) by mouth daily.  Marland Kitchen atorvastatin (LIPITOR) 80 MG tablet TAKE 1 TABLET BY MOUTH ONCE DAILY AT  6  PM  -  PLEASE  SCHEDULE  AN  APPOINTMENT  FOR  FUTURE  REFILLS  . carvedilol (COREG) 6.25 MG tablet Take 1 tablet (6.25 mg total) by mouth 2 (two) times daily.  . cilostazol (PLETAL) 50 MG  tablet Take 1 tablet (50 mg total) by mouth 2 (two) times daily.  . clopidogrel (PLAVIX) 75 MG tablet Take 1 tablet by mouth once daily  . ezetimibe (ZETIA) 10 MG tablet Take 10 mg by mouth daily.  . furosemide (LASIX) 20 MG tablet Take 1 tablet (20 mg total) by mouth daily as needed for fluid or edema.  Marland Kitchen levothyroxine (SYNTHROID) 125 MCG tablet Take 1 tablet (125 mcg total) by mouth daily before breakfast.  . lisinopril (ZESTRIL) 5 MG tablet Take 1 tablet (5 mg total) by mouth daily.  . nitroGLYCERIN (NITROSTAT) 0.4 MG SL tablet Place 1 tablet (0.4 mg total) under the tongue every 5 (five) minutes as needed for chest pain.     No Known Allergies  Social History   Socioeconomic History  . Marital status: Married    Spouse name: Not on file  . Number of children: Not on file  . Years of education: Not on file  . Highest education level: Not on file  Occupational History  . Occupation: mail carrier  Tobacco Use  . Smoking status: Former Smoker    Types: Cigarettes    Start date: 05/10/2018  . Smokeless tobacco: Never Used  Vaping Use  . Vaping Use: Never used  Substance and Sexual Activity  . Alcohol use: Not Currently  .  Drug use: Never  . Sexual activity: Not on file  Other Topics Concern  . Not on file  Social History Narrative  . Not on file   Social Determinants of Health   Financial Resource Strain: Not on file  Food Insecurity: Not on file  Transportation Needs: Not on file  Physical Activity: Not on file  Stress: Not on file  Social Connections: Not on file  Intimate Partner Violence: Not on file     Review of Systems: General: negative for chills, fever, night sweats or weight changes.  Cardiovascular: negative for chest pain, dyspnea on exertion, edema, orthopnea, palpitations, paroxysmal nocturnal dyspnea or shortness of breath Dermatological: negative for rash Respiratory: negative for cough or wheezing Urologic: negative for hematuria Abdominal:  negative for nausea, vomiting, diarrhea, bright red blood per rectum, melena, or hematemesis Neurologic: negative for visual changes, syncope, or dizziness All other systems reviewed and are otherwise negative except as noted above.    Blood pressure 132/78, pulse 66, height 5\' 5"  (1.651 m), weight 141 lb (64 kg), SpO2 98 %.  General appearance: alert and no distress Neck: no adenopathy, no carotid bruit, no JVD, supple, symmetrical, trachea midline and thyroid not enlarged, symmetric, no tenderness/mass/nodules Lungs: clear to auscultation bilaterally Heart: regular rate and rhythm, S1, S2 normal, no murmur, click, rub or gallop Extremities: extremities normal, atraumatic, no cyanosis or edema Pulses: 2+ and symmetric Absent pedal pulses Skin: Skin color, texture, turgor normal. No rashes or lesions Neurologic: Alert and oriented X 3, normal strength and tone. Normal symmetric reflexes. Normal coordination and gait  EKG sinus rhythm at 66 without ST or T wave changes.  I personally reviewed this EKG.  ASSESSMENT AND PLAN:   Peripheral arterial disease (HCC) History of PAD with ultrasound demonstrated left common iliac artery CTO with moderate proximal right SFA stenosis.  Since beginning on Pletal she really denies lifestyle limiting claudication although she does admit to not walking much given the weather.  At this point, there is no indication to perform endovascular therapy.  I will see her back in 6 months to revisit this.  I have told her that if she develops claudication in the interim I had be happy to see her and arrange angiography and intervention.  Hyperlipidemia History of hyperlipidemia on high-dose atorvastatin and Zetia with lipid profile performed 04/27/2020 revealing total cholesterol of 163, LDL of 106 and HDL 35.  And can refer her to Dr. 04/29/2020 for consideration of PCSK9 additional therapy.      Rennis Golden MD FACP,FACC,FAHA, [facility] 10/06/2020 9:59 AM

## 2020-10-18 ENCOUNTER — Other Ambulatory Visit: Payer: Self-pay | Admitting: Cardiology

## 2020-10-30 ENCOUNTER — Ambulatory Visit: Payer: 59 | Admitting: Cardiology

## 2020-11-13 ENCOUNTER — Ambulatory Visit: Payer: BLUE CROSS/BLUE SHIELD | Admitting: Cardiology

## 2020-11-15 ENCOUNTER — Other Ambulatory Visit: Payer: Self-pay | Admitting: Cardiology

## 2020-11-15 ENCOUNTER — Other Ambulatory Visit: Payer: Self-pay | Admitting: Cardiovascular Disease

## 2020-11-23 ENCOUNTER — Encounter: Payer: Self-pay | Admitting: Student

## 2020-11-23 NOTE — Progress Notes (Addendum)
Cardiology Office Note:    Date:  11/25/2020   ID:  Maria Zuniga, DOB 1966-07-01, MRN [id number]  PCP:  Maria Shivers, MD  Cardiologist:  Maria Martinique, MD / Quay Burow, MD (for PAD) Electrophysiologist:  None   Referring MD: Maria Shivers, MD   Chief Complaint: routine follow-up of CAD  History of Present Illness:    Maria Zuniga is a 55 y.o. female with a history of CAD with STEMI in 05/2019  s/p CABG x3 (LIMA to LAD, SVG to RI, SVG to LCX), ischemic cardiomyopathy with EF of 40-45% on Echo in 08/2019, bilateral carotid artery stenosis (left > right), PAD with right SFA disease and occluded left common iliac on dopplers in 06/2020 on Pletal, hypertension, hyperlipidemia, and hypothyroidism who is followed Dr. Martinique and Dr. Gwenlyn Zuniga and presents today for routine follow-up of CAD.   Patient admitted in 05/2019 with acute STEMI and was Zuniga to have severe left main and critical ostial LAD disease. Echo showed LVEF of 35% with multiple wall motion abnormalities. She underwent CABG x3 (LIMA to LAD, SVG to RI, SVG to LCX) on 05/17/2019. She has done well from a cardiac standpoint since this time. Last Echo in 08/2019 showed improvement in EF to 40-45% with septal and apical hypokinesis. No significant valvular disease. Patient was referred to Dr. Gwenlyn Zuniga in 06/2020 for symptomatic PAD. Dopplers prior to this visit showed right ABI of 1.09 and left ABI of 0.63 with moderately elevated signal in her mid right SFA and an occluded left common iliac artery. Patient did not wish to pursue intervention at that time so she was started on Pletal. She was last seen by Dr. Gwenlyn Zuniga on 10/06/2020 at which time she denied any complaints of claudication.   Patient presents today for routine follow-up. Here alone. Patient doing well since last visit. She continues to have some claudication but improves with rest. Manageable at this time. Reasonably well controlled on Pletal and not lifestyle limiting. Otherwise,  doing well from a cardiac standpoint. No chest pain, shortness of breath, orthopnea, PND, lower extremity edema, palpitations, lightheadedness, or dizziness. She currently is on Plaxis and Pletal. Aspirin also listed under her medications. However, patient states she is only taking this every other day because she bruises really easily. No other abnormal bleeding.   Lab from PCP's office on 11/10/2020: - CMP: Na 140, K 4.8, Glucose 116, BUN 15, Cr 0.77. AST 17, ALT 24, Alk Phos 88, Total Bili 0.3.  - Lipid panel: Total Cholesterol 103, Triglycerides 84, HDL 29, LDL 57.  - Hemoglobin A1c: 6.5.  - TSH <0.005.    Past Medical History:  Diagnosis Date  . CAD (coronary artery disease)    a. STEMI in 05/2019: LHC showed severe left main and ostial LAD disease s/p CABG x3 (LIMA to LAD, SVG to RI, SVG to LCX) on 05/17/2019  . Hyperlipidemia   . Hypertension   . PAD (peripheral artery disease) ([place])    a. Dopplers in 06/2020: right ABI 1.09 and left ABI 0.63 with moderately elevated signal in her mid right SFA and occluded left common iliac artery - on Pletal  . Thyroid disease     Past Surgical History:  Procedure Laterality Date  . CORONARY ARTERY BYPASS GRAFT N/A 05/17/2019   Procedure: CORONARY ARTERY BYPASS GRAFTING (CABG) x Three, using left internal mammary artery and right leg greater saphenous vein harvested endoscopically;  Surgeon: Maria Isaac, MD;  Location: [place];  Service: Open Heart Surgery;  Laterality:  N/A;  . HIP SURGERY    . KNEE SURGERY    . LEFT HEART CATH AND CORONARY ANGIOGRAPHY N/A 05/11/2019   Procedure: LEFT HEART CATH AND CORONARY ANGIOGRAPHY;  Surgeon: Zuniga, Maria M, MD;  Location: [facility];  Service: Cardiovascular;  Laterality: N/A;  . TEE WITHOUT CARDIOVERSION N/A 05/17/2019   Procedure: TRANSESOPHAGEAL ECHOCARDIOGRAM (TEE);  Surgeon: Maria Isaac, MD;  Location: [facility];  Service: Open Heart Surgery;  Laterality: N/A;  . TUBAL LIGATION       Current Medications: Current Meds  Medication Sig  . acetaminophen (TYLENOL) 500 MG tablet Take 2 tablets (1,000 mg total) by mouth every 6 (six) hours as needed for mild pain or fever.  Marland Kitchen atorvastatin (LIPITOR) 80 MG tablet Take 1 tablet by mouth once daily  . carvedilol (COREG) 6.25 MG tablet Take 1 tablet (6.25 mg total) by mouth 2 (two) times daily.  . cilostazol (PLETAL) 50 MG tablet Take 1 tablet (50 mg total) by mouth 2 (two) times daily.  . clopidogrel (PLAVIX) 75 MG tablet Take 1 tablet by mouth once daily  . ezetimibe (ZETIA) 10 MG tablet Take 10 mg by mouth daily.  . furosemide (LASIX) 20 MG tablet Take 1 tablet (20 mg total) by mouth daily as needed for fluid or edema.  Marland Kitchen levothyroxine (SYNTHROID) 125 MCG tablet Take 1 tablet (125 mcg total) by mouth daily before breakfast.  . lisinopril (ZESTRIL) 5 MG tablet Take 1 tablet (5 mg total) by mouth daily.  . [DISCONTINUED] aspirin EC 81 MG EC tablet Take 1 tablet (81 mg total) by mouth daily.     Allergies:   Patient has no known allergies.   Social History   Socioeconomic History  . Marital status: Married    Spouse name: Not on file  . Number of children: Not on file  . Years of education: Not on file  . Highest education level: Not on file  Occupational History  . Occupation: mail carrier  Tobacco Use  . Smoking status: Former Smoker    Types: Cigarettes    Start date: 05/10/2018  . Smokeless tobacco: Never Used  Vaping Use  . Vaping Use: Never used  Substance and Sexual Activity  . Alcohol use: Not Currently  . Drug use: Never  . Sexual activity: Not on file  Other Topics Concern  . Not on file  Social History Narrative  . Not on file   Social Determinants of Health   Financial Resource Strain: Not on file  Food Insecurity: Not on file  Transportation Needs: Not on file  Physical Activity: Not on file  Stress: Not on file  Social Connections: Not on file     Family History: The patient's family  history includes CAD in her mother; Heart attack in her brother; Heart disease in her maternal grandfather, maternal grandmother, and paternal grandmother.  ROS:   Please see the history of present illness.     EKGs/Labs/Other Studies Reviewed:    The following studies were reviewed today:  Left Cardiac Catheterization 05/11/2019:  Ost LM to Dist LM lesion is 75% stenosed.  Ost LAD lesion is 95% stenosed.  Ost Cx lesion is 60% stenosed.  Prox RCA lesion is 35% stenosed.  There is moderate left ventricular systolic dysfunction.  LV end diastolic pressure is moderately elevated.  The left ventricular ejection fraction is 35-45% by visual estimate.   1. Severe left main and critical ostial LAD stenosis. Severe dampening of pressures with any  catheter engagement.  2. Moderately elevated LVDEP.  3. Moderate LV dysfunction. EF estimated at 35-40% with anteroapical wall motion abnormality.   Plan: will admit to ICU. Aggressive medical therapy. Will consult CT surgery for consideration of CABG.  _______________  Complete Echocardiogram 08/26/2019: Impressions: 1. Left ventricular ejection fraction, by visual estimation, is 40 to  45%. The left ventricle has mild to moderately decreased function. Left  ventricular septal wall thickness was normal. There is mildly increased  left ventricular hypertrophy.  2. Mildly dilated left ventricular internal cavity size.  3. The left ventricle demonstrates regional wall motion abnormalities.  4. Septal and apical hypokinesis no mural apical thrombus.  5. Global right ventricle has normal systolic function.The right  ventricular size is normal. No increase in right ventricular wall  thickness.  6. Left atrial size was normal.  7. Right atrial size was normal.  8. The mitral valve is normal in structure. Trivial mitral valve  regurgitation.  9. The tricuspid valve is normal in structure.  10. The aortic valve is tricuspid. Aortic  valve regurgitation is not  visualized. Mild aortic valve sclerosis without stenosis.  11. The pulmonic valve was grossly normal. Pulmonic valve regurgitation is  Trivial. _______________  Carotid Ultrasound 06/12/2020: Summary:  - Right Carotid: Velocities in the right ICA are consistent with a 1-39% stenosis. Non-hemodynamically significant plaque <50% noted in the CCA. The ECA appears >50% stenosed.  - Left Carotid: Velocities in the left ICA are consistent with a 40-59% stenosis. Non-hemodynamically significant plaque <50% noted in the CCA.  - Vertebrals: Bilateral vertebral arteries demonstrate antegrade flow.  - Subclavians: Normal flow hemodynamics were seen in bilateral subclavian arteries.  _______________  Lower Extremity Dopplers 06/12/2020: Summary:  - Right: 50-74% stenosis noted at the origin of the superficial femoral artery.  - Left: Total occlusion of the left common iliac artery.   - Right: Resting right ankle-brachial index is within normal range. No evidence of significant right lower extremity arterial disease. The right toe-brachial index is normal.  - Left: Resting left ankle-brachial index indicates moderate left lower extremity arterial disease. The left toe-brachial index is abnormal.   EKG:  EKG ordered today. EKG personally reviewed and demonstrates: Normal sinus rhythm, rate 79 bpm, with Q waves in V1-V2 and biphasic T waves in leads I, aVL, and V2. Normal axis. Normal PR and QRS intervals. QTC 435 ms. No acute changes compared to prior tracings.  Recent Labs: 05/05/2020: ALT 50; BUN 13; Creatinine, Ser 0.91; Hemoglobin 14.9; Platelets 274; Potassium 4.2; Sodium 140; TSH 140.000  Recent Lipid Panel    Component Value Date/Time   CHOL 163 05/05/2020 1050   TRIG 120 05/05/2020 1050   HDL 35 (L) 05/05/2020 1050   CHOLHDL 3.7 05/11/2019 1337   VLDL 14 05/11/2019 1337   LDLCALC 106 (H) 05/05/2020 1050    Physical Exam:    Vital Signs: BP 133/61   Pulse  79   Ht 5' 3" (1.6 m)   Wt 140 lb 6.4 oz (63.7 kg)   SpO2 98%   BMI 24.87 kg/m     Wt Readings from Last 3 Encounters:  11/25/20 140 lb 6.4 oz (63.7 kg)  10/06/20 141 lb (64 kg)  07/07/20 143 lb (64.9 kg)     General: 55 y.o. female in no acute distress. HEENT: Normocephalic and atraumatic. Sclera clear.  Neck: Supple. Bilateral carotid bruits. No JVD. Heart: RRR. Distinct S1 and S2. II/VI systolic murmur. No gallops, or rubs. Radial  pulses 2+  and equal bilaterally. Lungs: No increased work of breathing. Clear to ausculation bilaterally. No wheezes, rhonchi, or rales.  Abdomen: Soft, non-distended, and non-tender to palpation.   Extremities: No lower extremity edema.    Skin: Warm and dry. Neuro: Alert and oriented x3. No focal deficits. Psych: Normal affect. Responds appropriately.  Assessment:    1. Coronary artery disease involving native coronary artery of native heart with other form of angina pectoris ([place])   2. Ischemic cardiomyopathy   3. Murmur   4. Bilateral stenosis of carotid arteries greater than 50%   5. PAD (peripheral artery disease) ([place])   6. Primary hypertension   7. Hyperlipidemia LDL goal <70   8. Type 2 diabetes mellitus with complication, without long-term current use of insulin (HCC)     Plan:    CAD - History of STEMI in 05/2019 s/p CABG x3. - No angina. - Continue Plavix.  - She is currently only taking Aspirin every other day due to significant bruising when on daily use. Given she is on Plavix and Pletal, I think it is likely OK to stop this. Will confirm with Dr. Martinique. - Continue beta-blocker as well as high-intensity statin and Zetia.  Ischemic Cardiomyopathy - LVEF 40-45% on Echo in 08/2019.  - Appears euvolemic on exam.  - Continue Lasix 20m daily.  - Continue Lisinopril 59mdaily and Coreg 6.252mwice daily.   Murmur - Did appreciate a soft murmur on exam. Patient states she has had this since she was a teen. - Last Echo in  08/2019 did not show any significant valvular disease. See full report above. - Discussed possible repeat Echo but patient declined at this time.  Bilateral Carotid Stenosis - Carotid dopplers in 06/2020 showed 40-59% stenosis of left ICA and 1-39% stenosis of right ICA.  - Continue Plavix as well as statin/Zetia. - Plan is for repeat dopplers in 06/2021.   PAD -  Dopplers in 06/2020 showed right ABI of 1.09 and left ABI of 0.63 with moderate mid right SFA disease and an occluded left common iliac artery. - Does report claudication but reasonably well controlled on Pletal. Not lifestyle limiting. - Continue Pletal 28m37mice daily.  - Follow-up with Dr. BerrGwenlyn Founddirected.  Hypertension - BP well controlled. - Continue Lisinopril and Coreg as above.   Hyperlipidemia  - Recent lipid panel at PCP's office on 11/10/2020: Total Cholesterol 103, Triglycerides 84, HDL 29, LDL 57.  - LDL <70 given CAD. - Continue Lipitor 80mg39mly and Zetia 10mg 52my.  - Labs followed by PCP.  Type 2 Diabetes Mellitus - Newly diagnosed. Recent hemoglobin A1c 6.5% on labs earlier this month.  - Plan is to try lifestyle modifications first.  - Could consider SGLT2 inhibitor in the future given underlying cardiomyopathy.  Disposition: Follow up in 1 year with Dr. JordanMartiniqueENDUM 11/26/2020 at 3:18PM: Dr. JordanMartiniquen agreement that Aspirin can be stopped given patient is on Plavix and Pletal.  Medication Adjustments/Labs and Tests Ordered: Current medicines are reviewed at length with the patient today.  Concerns regarding medicines are outlined above.  Orders Placed This Encounter  Procedures  . EKG 12-Lead   No orders of the defined types were placed in this encounter.   Patient Instructions  Medication Instructions:  Stop Aspirin *If you need a refill on your cardiac medications before your next appointment, please call your pharmacy*   Lab Work: No Labs If you have labs (blood work) drawn  today and  your tests are completely normal, you will receive your results only by: Marland Kitchen MyChart Message (if you have MyChart) OR . A paper copy in the mail If you have any lab test that is abnormal or we need to change your treatment, we will call you to review the results.   Testing/Procedures: No Testing   Follow-Up: At [facility], you and your health needs are our priority.  As part of our continuing mission to provide you with exceptional heart care, we have created designated Provider Care Teams.  These Care Teams include your primary Cardiologist (physician) and Advanced Practice Providers (APPs -  Physician Assistants and Nurse Practitioners) who all work together to provide you with the care you need, when you need it.      Your next appointment:   1 year(s)  The format for your next appointment:   In Person  Provider:   Peter Martinique, MD       Signed, Darreld Mclean, PA-C  11/25/2020 6:29 PM    [place]

## 2020-11-25 ENCOUNTER — Ambulatory Visit: Payer: BLUE CROSS/BLUE SHIELD | Admitting: Student

## 2020-11-25 ENCOUNTER — Encounter: Payer: Self-pay | Admitting: Student

## 2020-11-25 ENCOUNTER — Other Ambulatory Visit: Payer: Self-pay

## 2020-11-25 VITALS — BP 133/61 | HR 79 | Ht 63.0 in | Wt 140.4 lb

## 2020-11-25 DIAGNOSIS — I6523 Occlusion and stenosis of bilateral carotid arteries: Secondary | ICD-10-CM

## 2020-11-25 DIAGNOSIS — I25118 Atherosclerotic heart disease of native coronary artery with other forms of angina pectoris: Secondary | ICD-10-CM

## 2020-11-25 DIAGNOSIS — R011 Cardiac murmur, unspecified: Secondary | ICD-10-CM | POA: Diagnosis not present

## 2020-11-25 DIAGNOSIS — E118 Type 2 diabetes mellitus with unspecified complications: Secondary | ICD-10-CM

## 2020-11-25 DIAGNOSIS — E785 Hyperlipidemia, unspecified: Secondary | ICD-10-CM

## 2020-11-25 DIAGNOSIS — I739 Peripheral vascular disease, unspecified: Secondary | ICD-10-CM

## 2020-11-25 DIAGNOSIS — I255 Ischemic cardiomyopathy: Secondary | ICD-10-CM

## 2020-11-25 DIAGNOSIS — I10 Essential (primary) hypertension: Secondary | ICD-10-CM

## 2020-11-25 NOTE — Patient Instructions (Signed)
Medication Instructions:  Stop Aspirin *If you need a refill on your cardiac medications before your next appointment, please call your pharmacy*   Lab Work: No Labs If you have labs (blood work) drawn today and your tests are completely normal, you will receive your results only by: Marland Kitchen MyChart Message (if you have MyChart) OR . A paper copy in the mail If you have any lab test that is abnormal or we need to change your treatment, we will call you to review the results.   Testing/Procedures: No Testing   Follow-Up: At [facility], you and your health needs are our priority.  As part of our continuing mission to provide you with exceptional heart care, we have created designated Provider Care Teams.  These Care Teams include your primary Cardiologist (physician) and Advanced Practice Providers (APPs -  Physician Assistants and Nurse Practitioners) who all work together to provide you with the care you need, when you need it.      Your next appointment:   1 year(s)  The format for your next appointment:   In Person  Provider:   Peter [place], MD

## 2021-04-15 ENCOUNTER — Other Ambulatory Visit: Payer: Self-pay | Admitting: Cardiology

## 2021-05-16 ENCOUNTER — Other Ambulatory Visit: Payer: Self-pay | Admitting: Cardiology

## 2021-06-04 ENCOUNTER — Other Ambulatory Visit (HOSPITAL_COMMUNITY): Payer: Self-pay | Admitting: Cardiology

## 2021-06-04 DIAGNOSIS — I6523 Occlusion and stenosis of bilateral carotid arteries: Secondary | ICD-10-CM

## 2021-06-18 ENCOUNTER — Ambulatory Visit (HOSPITAL_COMMUNITY): Admission: RE | Admit: 2021-06-18 | Payer: BLUE CROSS/BLUE SHIELD | Source: Ambulatory Visit

## 2021-06-18 ENCOUNTER — Ambulatory Visit (HOSPITAL_COMMUNITY): Payer: BLUE CROSS/BLUE SHIELD

## 2021-06-30 ENCOUNTER — Ambulatory Visit (HOSPITAL_COMMUNITY): Payer: BLUE CROSS/BLUE SHIELD

## 2021-06-30 ENCOUNTER — Ambulatory Visit (HOSPITAL_COMMUNITY)
Admission: RE | Admit: 2021-06-30 | Payer: BLUE CROSS/BLUE SHIELD | Source: Ambulatory Visit | Attending: Cardiovascular Disease | Admitting: Cardiovascular Disease

## 2021-08-05 ENCOUNTER — Other Ambulatory Visit: Payer: Self-pay | Admitting: Cardiology

## 2021-08-05 ENCOUNTER — Other Ambulatory Visit: Payer: Self-pay | Admitting: Cardiovascular Disease

## 2021-08-05 DIAGNOSIS — I739 Peripheral vascular disease, unspecified: Secondary | ICD-10-CM

## 2021-10-09 ENCOUNTER — Other Ambulatory Visit: Payer: Self-pay | Admitting: Cardiovascular Disease

## 2021-11-06 ENCOUNTER — Other Ambulatory Visit: Payer: Self-pay | Admitting: Cardiology

## 2021-11-08 ENCOUNTER — Other Ambulatory Visit: Payer: Self-pay | Admitting: Physician Assistant

## 2021-12-16 ENCOUNTER — Encounter: Payer: Self-pay | Admitting: Cardiology

## 2021-12-17 ENCOUNTER — Ambulatory Visit: Payer: BLUE CROSS/BLUE SHIELD | Admitting: Cardiology

## 2021-12-17 ENCOUNTER — Encounter: Payer: Self-pay | Admitting: Cardiology

## 2021-12-17 VITALS — BP 114/70 | HR 74 | Ht 62.0 in | Wt 154.0 lb

## 2021-12-17 DIAGNOSIS — E782 Mixed hyperlipidemia: Secondary | ICD-10-CM

## 2021-12-17 DIAGNOSIS — I2510 Atherosclerotic heart disease of native coronary artery without angina pectoris: Secondary | ICD-10-CM | POA: Diagnosis not present

## 2021-12-17 DIAGNOSIS — I739 Peripheral vascular disease, unspecified: Secondary | ICD-10-CM

## 2021-12-17 DIAGNOSIS — Z951 Presence of aortocoronary bypass graft: Secondary | ICD-10-CM

## 2021-12-17 MED ORDER — NITROGLYCERIN 0.4 MG SL SUBL: 0.4000 mg | SUBLINGUAL_TABLET | SUBLINGUAL | 3 refills | Status: DC | PRN

## 2021-12-17 MED ORDER — NEXLIZET 180-10 MG PO TABS: ORAL_TABLET | ORAL | 3 refills | Status: DC

## 2021-12-17 NOTE — Patient Instructions (Signed)
Medication Instructions:  ?Your physician has recommended you make the following change in your medication:  ? ?STOP: Zetia ?START Nexlizet 180-10 mg daily ? ?*If you need a refill on your cardiac medications before your next appointment, please call your pharmacy* ? ? ?Lab Work: ?Your physician recommends that you return for lab work in:  ? ?Labs today: BMP, LFT ?Labs in 6 weeks: BMP, LFT, Lipids ? ?If you have labs (blood work) drawn today and your tests are completely normal, you will receive your results only by: ?MyChart Message (if you have MyChart) OR ?A paper copy in the mail ?If you have any lab test that is abnormal or we need to change your treatment, we will call you to review the results. ? ? ?Testing/Procedures: ?None ? ? ?Follow-Up: ?At [facility], you and your health needs are our priority.  As part of our continuing mission to provide you with exceptional heart care, we have created designated Provider Care Teams.  These Care Teams include your primary Cardiologist (physician) and Advanced Practice Providers (APPs -  Physician Assistants and Nurse Practitioners) who all work together to provide you with the care you need, when you need it. ? ?We recommend signing up for the patient portal called "MyChart".  Sign up information is provided on this After Visit Summary.  MyChart is used to connect with patients for Virtual Visits (Telemedicine).  Patients are able to view lab/test results, encounter notes, upcoming appointments, etc.  Non-urgent messages can be sent to your provider as well.   ?To learn more about what you can do with MyChart, go to [email or web address].   ? ?Your next appointment:   ?1 year(s) ? ?The format for your next appointment:   ?In Person ? ?Provider:   ?Belva Crome, MD  ? ? ?Other Instructions ?None ? ?Important Information About Sugar ? ? ? ? ? ? ?

## 2021-12-17 NOTE — Progress Notes (Signed)
?Cardiology Office Note:   ? ?Date:  12/17/2021  ? ?ID:  Maria Zuniga, DOB [birth year], MRN [id number] ? ?PCP:  Charlott RakesHodges, Francisco, MD  ?Cardiologist:  Garwin Brothersajan R Ayad Nieman, MD  ? ?Referring MD: Abner GreenspanHodges, Beth, MD  ? ? ?ASSESSMENT:   ? ?1. Coronary artery disease involving native coronary artery of native heart without angina pectoris   ?2. Mixed hyperlipidemia   ?3. Peripheral arterial disease (HCC)   ?4. S/P CABG x 3   ? ?PLAN:   ? ?In order of problems listed above: ? ?Coronary artery disease: Secondary prevention stressed with the patient.  Importance of compliance with diet medication stressed and she vocalized understanding.  She was advised to walk at least half an hour a day 5 days a week and she promises to do so. ?Essential hypertension: Blood pressure stable and diet was emphasized lifestyle modification urged. ?Mixed dyslipidemia: On lipid-lowering medications.  Recent lipids reviewed.  They are not at goal and following suggestions were made.  I have added Nexlizet at and stopped Zetia.  She is agreeable.  We will do a Chem-7 and liver panel today and initiate this and she will come back in 6 weeks for liver lipid check.  Diet was emphasized.  Lifestyle modification urged. ?Peripheral arterial disease: Stable and asymptomatic.  No claudication issues. ?Diabetes mellitus: Managed by primary care.  Diet emphasized. ?Patient will be seen in follow-up appointment in 12 months or earlier if the patient has any concerns ? ? ? ?Medication Adjustments/Labs and Tests Ordered: ?Current medicines are reviewed at length with the patient today.  Concerns regarding medicines are outlined above.  ?Orders Placed This Encounter  ?Procedures  ? Basic Metabolic Panel (BMET)  ? Hepatic function panel  ? EKG 12-Lead  ? ?No orders of the defined types were placed in this encounter. ? ? ? ?No chief complaint on file. ?  ? ?History of Present Illness:   ? ?Maria PotashDarlene Klindt is a 56 y.o. female.  Patient has past medical history of coronary  artery disease post CABG surgery, essential hypertension, dyslipidemia, peripheral arterial disease and diabetes mellitus.  She denies any problems at this time and takes care of activities of daily living.  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  At the time of my evaluation, the patient is alert awake oriented and in no distress. ? ?Past Medical History:  ?Diagnosis Date  ? Acute kidney insufficiency   ? CAD (coronary artery disease)   ? a. STEMI in 05/2019: LHC showed severe left main and ostial LAD disease s/p CABG x3 (LIMA to LAD, SVG to RI, SVG to LCX) on 05/17/2019  ? Diabetes mellitus with peripheral vascular disease (HCC)   ? Elevated LFTs   ? Hyperlipidemia   ? Hypertension   ? Hypothyroidism   ? Mixed dyslipidemia   ? PAD (peripheral artery disease) (HCC)   ? a. Dopplers in 06/2020: right ABI 1.09 and left ABI 0.63 with moderately elevated signal in her mid right SFA and occluded left common iliac artery - on Pletal  ? Senile purpura (HCC)   ? ? ?Past Surgical History:  ?Procedure Laterality Date  ? CORONARY ARTERY BYPASS GRAFT N/A 05/17/2019  ? Procedure: CORONARY ARTERY BYPASS GRAFTING (CABG) x Three, using left internal mammary artery and right leg greater saphenous vein harvested endoscopically;  Surgeon: Delight OvensGerhardt, Edward B, MD;  [facility];  Service: Open Heart Surgery;  Laterality: N/A;  ?  HIP SURGERY Right 1996  ? KNEE SURGERY    ? LEFT HEART CATH AND CORONARY ANGIOGRAPHY N/A 05/11/2019  ? Procedure: LEFT HEART CATH AND CORONARY ANGIOGRAPHY;  Surgeon: Swaziland, Peter M, MD;  Location[facility] INVASIVE CV LAB;  Service: Cardiovascular;  Laterality: N/A;  ? TEE WITHOUT CARDIOVERSION N/A 05/17/2019  ? Procedure: TRANSESOPHAGEAL ECHOCARDIOGRAM (TEE);  Surgeon: Delight Ovens, MD;  Location: [facility] OR;  Service: Open Heart Surgery;  Laterality: N/A;  ? TUBAL LIGATION    ? ? ?Current Medications: ?Current Meds  ?Medication Sig  ?  aspirin EC 81 MG tablet Take 81 mg by mouth daily. Swallow whole.  ? atorvastatin (LIPITOR) 80 MG tablet Take 1 tablet by mouth once daily  ? carvedilol (COREG) 6.25 MG tablet Take 1 tablet (6.25 mg total) by mouth 2 (two) times daily. PATIENT MUST SCHEDULE ANNUAL APPOINTMENT FOR FUTURE REFILLS  ? cilostazol (PLETAL) 100 MG tablet Take 100 mg by mouth daily.  ? clopidogrel (PLAVIX) 75 MG tablet Take 1 tablet by mouth once daily  ? ezetimibe (ZETIA) 10 MG tablet Take 10 mg by mouth daily.  ? fenofibrate 54 MG tablet Take 54 mg by mouth daily.  ? levothyroxine (SYNTHROID) 50 MCG tablet Take 50 mcg by mouth daily before breakfast.  ? lisinopril (ZESTRIL) 5 MG tablet Take 1 tablet (5 mg total) by mouth daily.  ? nitroGLYCERIN (NITROSTAT) 0.4 MG SL tablet Place 0.4 mg under the tongue every 5 (five) minutes as needed for chest pain.  ?  ? ?Allergies:   Patient has no known allergies.  ? ?Social History  ? ?Socioeconomic History  ? Marital status: Married  ?  Spouse name: Not on file  ? Number of children: Not on file  ? Years of education: Not on file  ? Highest education level: Not on file  ?Occupational History  ? Occupation: mail carrier  ?Tobacco Use  ? Smoking status: Former  ?  Types: Cigarettes  ?  Start date: 05/10/2018  ?  Passive exposure: Past  ? Smokeless tobacco: Never  ?Vaping Use  ? Vaping Use: Never used  ?Substance and Sexual Activity  ? Alcohol use: Not Currently  ? Drug use: Never  ? Sexual activity: Not on file  ?Other Topics Concern  ? Not on file  ?Social History Narrative  ? Not on file  ? ?Social Determinants of Health  ? ?Financial Resource Strain: Not on file  ?Food Insecurity: Not on file  ?Transportation Needs: Not on file  ?Physical Activity: Not on file  ?Stress: Not on file  ?Social Connections: Not on file  ?  ? ?Family History: ?The patient's family history includes CAD in her mother; Cancer in her father; Heart disease in her brother, maternal grandfather, maternal grandmother, and  paternal grandmother; Pancreatic cancer in her brother; Thyroid disease in her mother. ? ?ROS:   ?Please see the history of present illness.    ?All other systems reviewed and are negative. ? ?EKGs/Labs/Other Studies Reviewed:   ? ?The following studies were reviewed today: ?EKG reveals sinus rhythm and nonspecific ST-T changes ? ? ?Recent Labs: ?No results found for requested labs within last 8760 hours.  ?Recent Lipid Panel ?   ?Component Value Date/Time  ? CHOL 163 05/05/2020 1050  ? TRIG 120 05/05/2020 1050  ? HDL 35 (L) 05/05/2020 1050  ? CHOLHDL 3.7 05/11/2019 1337  ? VLDL 14 05/11/2019 1337  ? LDLCALC 106 (H) 05/05/2020 1050  ? ? ?Physical Exam:   ? ?VS:  BP 114/70 (BP Location: Left Arm)   Pulse 74   Ht 5\' 2"  (1.575 m)   Wt 154 lb (69.9 kg)   SpO2 97%   BMI 28.17 kg/m?    ? ?Wt Readings from Last 3 Encounters:  ?12/17/21 154 lb (69.9 kg)  ?12/07/21 157 lb 2.1 oz (71.3 kg)  ?11/25/20 140 lb 6.4 oz (63.7 kg)  ?  ? ?GEN: Patient is in no acute distress ?HEENT: Normal ?NECK: No JVD; No carotid bruits ?LYMPHATICS: No lymphadenopathy ?CARDIAC: Hear sounds regular, 2/6 systolic murmur at the apex. ?RESPIRATORY:  Clear to auscultation without rales, wheezing or rhonchi  ?ABDOMEN: Soft, non-tender, non-distended ?MUSCULOSKELETAL:  No edema; No deformity  ?SKIN: Warm and dry ?NEUROLOGIC:  Alert and oriented x 3 ?PSYCHIATRIC:  Normal affect  ? ?Signed, ?11/27/20, MD  ?12/17/2021 2:19 PM    ?[facility]  ?

## 2021-12-18 LAB — HEPATIC FUNCTION PANEL
ALT: 26 IU/L (ref 0–32)
AST: 21 IU/L (ref 0–40)
Albumin: 4.9 g/dL (ref 3.8–4.9)
Alkaline Phosphatase: 75 IU/L (ref 44–121)
Bilirubin Total: 0.4 mg/dL (ref 0.0–1.2)
Bilirubin, Direct: 0.14 mg/dL (ref 0.00–0.40)
Total Protein: 7.2 g/dL (ref 6.0–8.5)

## 2021-12-18 LAB — BASIC METABOLIC PANEL
BUN/Creatinine Ratio: 13 (ref 9–23)
BUN: 15 mg/dL (ref 6–24)
CO2: 24 mmol/L (ref 20–29)
Calcium: 9.6 mg/dL (ref 8.7–10.2)
Chloride: 103 mmol/L (ref 96–106)
Creatinine, Ser: 1.16 mg/dL — ABNORMAL HIGH (ref 0.57–1.00)
Glucose: 83 mg/dL (ref 70–99)
Potassium: 4.2 mmol/L (ref 3.5–5.2)
Sodium: 143 mmol/L (ref 134–144)
eGFR: 55 mL/min/{1.73_m2} — ABNORMAL LOW (ref >59)

## 2021-12-20 ENCOUNTER — Other Ambulatory Visit: Payer: Self-pay | Admitting: Cardiovascular Disease

## 2021-12-20 ENCOUNTER — Other Ambulatory Visit: Payer: Self-pay | Admitting: Cardiology

## 2021-12-23 ENCOUNTER — Other Ambulatory Visit: Payer: Self-pay

## 2021-12-29 ENCOUNTER — Telehealth: Payer: Self-pay

## 2021-12-29 NOTE — Telephone Encounter (Signed)
Left VM to confirm with pt the last stent placement.  If patient had a stent more than a year ago then only aspirin is fine to.  Plavix can be discontinued.

## 2021-12-29 NOTE — Telephone Encounter (Signed)
Recommendations reviewed with pt as per Dr. Revankar's note.  Pt verbalized understanding and had no additional questions.   

## 2021-12-29 NOTE — Addendum Note (Signed)
Addended by: Eleonore Chiquito on: 12/29/2021 01:47 PM   Modules accepted: Orders

## 2021-12-29 NOTE — Telephone Encounter (Signed)
Cath report 05/2019  Ost LM to Dist LM lesion is 75% stenosed. Ost LAD lesion is 95% stenosed. Ost Cx lesion is 60% stenosed. Prox RCA lesion is 35% stenosed. There is moderate left ventricular systolic dysfunction. LV end diastolic pressure is moderately elevated. The left ventricular ejection fraction is 35-45% by visual estimate.   1. Severe left main and critical ostial LAD stenosis. Severe dampening of pressures with any catheter engagement.  2. Moderately elevated LVDEP.  3. Moderate LV dysfunction. EF estimated at 35-40% with anteroapical wall motion abnormality.    Plan: will admit to ICU. Aggressive medical therapy. Will consult CT surgery for consideration of CABG.

## 2022-07-03 ENCOUNTER — Other Ambulatory Visit: Payer: Self-pay | Admitting: Cardiology

## 2022-07-04 NOTE — Telephone Encounter (Signed)
Rx refill sent to pharmacy. 

## 2022-08-13 ENCOUNTER — Other Ambulatory Visit: Payer: Self-pay | Admitting: Cardiovascular Disease

## 2022-08-13 DIAGNOSIS — I739 Peripheral vascular disease, unspecified: Secondary | ICD-10-CM

## 2022-12-06 ENCOUNTER — Other Ambulatory Visit: Payer: Self-pay | Admitting: Cardiology

## 2023-02-02 ENCOUNTER — Other Ambulatory Visit: Payer: Self-pay | Admitting: Cardiology

## 2023-02-08 ENCOUNTER — Other Ambulatory Visit: Payer: Self-pay | Admitting: Cardiology

## 2023-03-06 ENCOUNTER — Other Ambulatory Visit: Payer: Self-pay | Admitting: Cardiology

## 2023-03-08 ENCOUNTER — Telehealth: Payer: Self-pay | Admitting: Cardiology

## 2023-03-08 ENCOUNTER — Other Ambulatory Visit: Payer: Self-pay

## 2023-03-08 MED ORDER — CARVEDILOL 6.25 MG PO TABS: 6.2500 mg | ORAL_TABLET | Freq: Two times a day (BID) | ORAL | 3 refills | Status: DC

## 2023-03-08 MED ORDER — LEVOTHYROXINE SODIUM 50 MCG PO TABS: 50.0000 ug | ORAL_TABLET | Freq: Every day | ORAL | 3 refills | Status: AC

## 2023-03-08 MED ORDER — ATORVASTATIN CALCIUM 80 MG PO TABS: 80.0000 mg | ORAL_TABLET | Freq: Every day | ORAL | 3 refills | Status: DC

## 2023-03-08 NOTE — Telephone Encounter (Signed)
*  STAT* If patient is at the pharmacy, call can be transferred to refill team.   1. Which medications need to be refilled? (please list name of each medication and dose if known) atorvastatin (LIPITOR) 80 MG tablet   carvedilol (COREG) 6.25 MG tablet    levothyroxine (SYNTHROID) 50 MCG tablet   2. Which pharmacy/location (including street and city if local pharmacy) is medication to be sent to?[facility] [address] [place], [place] - [address] [address]   3. Do they need a 30 day or 90 day supply? 30 day

## 2023-03-12 ENCOUNTER — Other Ambulatory Visit: Payer: Self-pay | Admitting: Cardiology

## 2023-03-17 ENCOUNTER — Other Ambulatory Visit: Payer: Self-pay | Admitting: Cardiology

## 2023-03-17 ENCOUNTER — Telehealth: Payer: Self-pay | Admitting: Cardiology

## 2023-03-17 MED ORDER — NEXLIZET 180-10 MG PO TABS: 1.0000 | ORAL_TABLET | Freq: Every day | ORAL | 0 refills | Status: DC

## 2023-03-17 NOTE — Telephone Encounter (Signed)
Pt c/o medication issue:  1. Name of Medication:   Bempedoic Acid-Ezetimibe (NEXLIZET) 180-10 MG TABS    2. How are you currently taking this medication (dosage and times per day)?   3. Are you having a reaction (difficulty breathing--STAT)?   4. What is your medication issue? Patient was only sent in 15 day for this medication due to needing to make appt. Patient states she has already made an appt and needs more sent in.

## 2023-03-17 NOTE — Addendum Note (Signed)
Addended by: Gailen Venne, Elmarie Shiley L on: 03/17/2023 03:58 PM   Modules accepted: Orders

## 2023-03-17 NOTE — Telephone Encounter (Signed)
Refills of Nexlizet 180-10 mg sent until patient's appointment on 05/10/23.

## 2023-05-01 ENCOUNTER — Other Ambulatory Visit: Payer: Self-pay

## 2023-05-01 MED ORDER — NEXLIZET 180-10 MG PO TABS: 1.0000 | ORAL_TABLET | Freq: Every day | ORAL | 0 refills | Status: DC

## 2023-05-08 DIAGNOSIS — I2510 Atherosclerotic heart disease of native coronary artery without angina pectoris: Secondary | ICD-10-CM | POA: Insufficient documentation

## 2023-05-08 DIAGNOSIS — D692 Other nonthrombocytopenic purpura: Secondary | ICD-10-CM | POA: Insufficient documentation

## 2023-05-08 DIAGNOSIS — E1151 Type 2 diabetes mellitus with diabetic peripheral angiopathy without gangrene: Secondary | ICD-10-CM | POA: Insufficient documentation

## 2023-05-08 DIAGNOSIS — I739 Peripheral vascular disease, unspecified: Secondary | ICD-10-CM | POA: Insufficient documentation

## 2023-05-08 DIAGNOSIS — E782 Mixed hyperlipidemia: Secondary | ICD-10-CM | POA: Insufficient documentation

## 2023-05-08 DIAGNOSIS — N289 Disorder of kidney and ureter, unspecified: Secondary | ICD-10-CM | POA: Insufficient documentation

## 2023-05-08 DIAGNOSIS — R7989 Other specified abnormal findings of blood chemistry: Secondary | ICD-10-CM | POA: Insufficient documentation

## 2023-05-10 ENCOUNTER — Ambulatory Visit: Payer: Federal, State, Local not specified - PPO | Attending: Cardiology | Admitting: Cardiology

## 2023-05-10 ENCOUNTER — Encounter: Payer: Self-pay | Admitting: Cardiology

## 2023-05-10 VITALS — BP 134/70 | HR 65 | Ht 62.0 in | Wt 159.0 lb

## 2023-05-10 DIAGNOSIS — E782 Mixed hyperlipidemia: Secondary | ICD-10-CM

## 2023-05-10 DIAGNOSIS — Z951 Presence of aortocoronary bypass graft: Secondary | ICD-10-CM

## 2023-05-10 DIAGNOSIS — E1151 Type 2 diabetes mellitus with diabetic peripheral angiopathy without gangrene: Secondary | ICD-10-CM | POA: Diagnosis not present

## 2023-05-10 DIAGNOSIS — I2510 Atherosclerotic heart disease of native coronary artery without angina pectoris: Secondary | ICD-10-CM

## 2023-05-10 DIAGNOSIS — Z1321 Encounter for screening for nutritional disorder: Secondary | ICD-10-CM

## 2023-05-10 NOTE — Progress Notes (Signed)
Cardiology Office Note:    Date:  05/10/2023   ID:  Maria Zuniga, DOB [birth year], MRN [id number]  PCP:  Charlott Rakes, MD  Cardiologist:  Garwin Brothers, MD   Referring MD: Charlott Rakes, MD    ASSESSMENT:    1. Coronary artery disease involving native coronary artery of native heart without angina pectoris   2. S/P CABG x 3   3. Diabetes mellitus with peripheral vascular disease (HCC)   4. Mixed dyslipidemia    PLAN:    In order of problems listed above:  Coronary artery disease and peripheral vascular disease: Secondary prevention stressed to the patient.  Importance of compliance with diet medication stressed and she verbalized understanding.  She was advised to walk at least half an hour a day 5 days a week and she promises to do so. Essential hypertension: Blood pressure stable and diet was emphasized.  Lifestyle modification urged. Mixed dyslipidemia: On lipid-lowering medication..  She has not had blood work for more than a year and she will have blood work in the next couple of days.  She promises to come and get complete blood work.  She requests hemoglobin A1c and vitamin D and we will do this for her. Patient will be seen in follow-up appointment in 9 months or earlier if the patient has any concerns. \   Medication Adjustments/Labs and Tests Ordered: Current medicines are reviewed at length with the patient today.  Concerns regarding medicines are outlined above.  Orders Placed This Encounter  Procedures   EKG 12-Lead   No orders of the defined types were placed in this encounter.    No chief complaint on file.    History of Present Illness:    Maria Zuniga is a 57 y.o. female.  Patient has past medical history of coronary artery disease, essential hypertension, mixed dyslipidemia and peripheral vascular disease.  She denies any problems at this time and takes care of activities of daily living.  No chest pain orthopnea or PND.  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  At the time of my evaluation, the patient is alert awake oriented and in no distress.  Past Medical History:  Diagnosis Date   Acute kidney insufficiency    CAD (coronary artery disease)    a. STEMI in 05/2019: LHC showed severe left main and ostial LAD disease s/p CABG x3 (LIMA to LAD, SVG to RI, SVG to LCX) on 05/17/2019     Coronary artery disease 05/17/2019   Coronary artery disease involving native heart without angina pectoris    Diabetes mellitus with peripheral vascular disease (HCC)    Elevated LFTs    HTN (hypertension) 05/11/2019   Hyperlipidemia    Hypothyroidism    Mixed dyslipidemia    PAD (peripheral artery disease) (HCC)    a. Dopplers in 06/2020: right ABI 1.09 and left ABI 0.63 with moderately elevated signal in her mid right SFA and occluded left common iliac artery - on Pletal   Peripheral arterial disease (HCC) 07/07/2020   Peripheral arterial disease     S/P CABG x 3 05/17/2019   LIMA to LAD  SVG to OM  SVG to RAMUS INTERMEDIATE     Senile purpura (HCC)    STEMI involving left anterior descending coronary artery (HCC) 05/11/2019    Past Surgical History:  Procedure Laterality Date   CORONARY ARTERY BYPASS GRAFT N/A 05/17/2019   Procedure: CORONARY ARTERY BYPASS GRAFTING (CABG) x  Three, using left internal mammary artery and right leg greater saphenous vein harvested endoscopically;  Surgeon: Delight Ovens, MD;  Location: [facility] OR;  Service: Open Heart Surgery;  Laterality: N/A;   HIP SURGERY Right 1996   KNEE SURGERY     LEFT HEART CATH AND CORONARY ANGIOGRAPHY N/A 05/11/2019   Procedure: LEFT HEART CATH AND CORONARY ANGIOGRAPHY;  Surgeon: Swaziland, Peter M, MD;  Location: [facility] INVASIVE CV LAB;  Service: Cardiovascular;  Laterality: N/A;   TEE WITHOUT CARDIOVERSION N/A 05/17/2019   Procedure: TRANSESOPHAGEAL ECHOCARDIOGRAM (TEE);  Surgeon: Delight Ovens, MD;  Location: [facility] OR;  Service: Open Heart Surgery;   Laterality: N/A;   TUBAL LIGATION      Current Medications: Current Meds  Medication Sig   aspirin EC 81 MG tablet Take 81 mg by mouth daily. Swallow whole.   atorvastatin (LIPITOR) 80 MG tablet Take 1 tablet (80 mg total) by mouth daily.   Bempedoic Acid-Ezetimibe (NEXLIZET) 180-10 MG TABS Take 1 tablet by mouth daily. Patient must keep appointment for 05/10/23 for further refills. 3 rd/final attempt   carvedilol (COREG) 6.25 MG tablet Take 1 tablet (6.25 mg total) by mouth 2 (two) times daily with a meal. Patient needs appointment for further refills. 1 st attempt   cilostazol (PLETAL) 100 MG tablet Take 100 mg by mouth daily.   fenofibrate 54 MG tablet Take 54 mg by mouth daily.   levothyroxine (SYNTHROID) 50 MCG tablet Take 1 tablet (50 mcg total) by mouth daily before breakfast.   lisinopril (ZESTRIL) 5 MG tablet Take 5 mg by mouth daily.   nitroGLYCERIN (NITROSTAT) 0.4 MG SL tablet Place 0.4 mg under the tongue every 5 (five) minutes as needed for chest pain.     Allergies:   Patient has no known allergies.   Social History   Socioeconomic History   Marital status: Married    Spouse name: Not on file   Number of children: Not on file   Years of education: Not on file   Highest education level: Not on file  Occupational History   Occupation: mail carrier  Tobacco Use   Smoking status: Former    Types: Cigarettes    Start date: 05/10/2018    Passive exposure: Past   Smokeless tobacco: Never  Vaping Use   Vaping status: Never Used  Substance and Sexual Activity   Alcohol use: Not Currently   Drug use: Never   Sexual activity: Not on file  Other Topics Concern   Not on file  Social History Narrative   Not on file   Social Determinants of Health   Financial Resource Strain: Not on file  Food Insecurity: Not on file  Transportation Needs: Not on file  Physical Activity: Not on file  Stress: Not on file  Social Connections: Not on file     Family History: The  patient's family history includes CAD in her mother; Cancer in her father; Heart disease in her brother, maternal grandfather, maternal grandmother, and paternal grandmother; Pancreatic cancer in her brother; Thyroid disease in her mother.  ROS:   Please see the history of present illness.    All other systems reviewed and are negative.  EKGs/Labs/Other Studies Reviewed:    The following studies were reviewed today: .Marland KitchenEKG Interpretation Date/Time:  Wednesday May 10 2023 15:57:23 EDT Ventricular Rate:  65 PR Interval:  164 QRS Duration:  68 QT Interval:  312 QTC Calculation: 324 R Axis:   36  Text Interpretation: Normal sinus rhythm  Low voltage QRS Septal infarct (cited on or before 12-May-2019) Abnormal ECG When compared with ECG of 18-May-2019 06:36, ST no longer elevated in Inferior leads ST now depressed in Lateral leads Nonspecific T wave abnormality now evident in Inferior leads Nonspecific T wave abnormality has replaced inverted T waves in Anterolateral leads QT has shortened Confirmed by Belva Crome [phone]) on 05/10/2023 4:11:36 PM     Recent Labs: No results found for requested labs within last 365 days.  Recent Lipid Panel    Component Value Date/Time   CHOL 163 05/05/2020 1050   TRIG 120 05/05/2020 1050   HDL 35 (L) 05/05/2020 1050   CHOLHDL 3.7 05/11/2019 1337   VLDL 14 05/11/2019 1337   LDLCALC 106 (H) 05/05/2020 1050    Physical Exam:    VS:  BP 134/70   Pulse 65   Ht 5\' 2"  (1.575 m)   Wt 159 lb (72.1 kg)   SpO2 97%   BMI 29.08 kg/m     Wt Readings from Last 3 Encounters:  05/10/23 159 lb (72.1 kg)  12/17/21 154 lb (69.9 kg)  12/07/21 157 lb 2.1 oz (71.3 kg)     GEN: Patient is in no acute distress HEENT: Normal NECK: No JVD; No carotid bruits LYMPHATICS: No lymphadenopathy CARDIAC: Hear sounds regular, 2/6 systolic murmur at the apex. RESPIRATORY:  Clear to auscultation without rales, wheezing or rhonchi  ABDOMEN: Soft, non-tender,  non-distended MUSCULOSKELETAL:  No edema; No deformity  SKIN: Warm and dry NEUROLOGIC:  Alert and oriented x 3 PSYCHIATRIC:  Normal affect   Signed, Garwin Brothers, MD  05/10/2023 4:17 PM    [facility]

## 2023-05-10 NOTE — Patient Instructions (Signed)
Medication Instructions:  Your physician recommends that you continue on your current medications as directed. Please refer to the Current Medication list given to you today.  *If you need a refill on your cardiac medications before your next appointment, please call your pharmacy*   Lab Work: Your physician recommends that you return for lab work in: the next few days You need to have labs done when you are fasting.  You can come Monday through Friday 8:30 am to 12:00 pm and 1:15 to 4:30. You do not need to make an appointment as the order has already been placed. The labs you are going to have done are CMP, CBC, TSH, A1C, vitamin D and Lipids.  If you have labs (blood work) drawn today and your tests are completely normal, you will receive your results only by: MyChart Message (if you have MyChart) OR A paper copy in the mail If you have any lab test that is abnormal or we need to change your treatment, we will call you to review the results.   Testing/Procedures: Your physician has requested that you have an echocardiogram. Echocardiography is a painless test that uses sound waves to create images of your heart. It provides your doctor with information about the size and shape of your heart and how well your heart's chambers and valves are working. This procedure takes approximately one hour. There are no restrictions for this procedure. Please do NOT wear cologne, perfume, aftershave, or lotions (deodorant is allowed). Please arrive 15 minutes prior to your appointment time.     Follow-Up: At [facility], you and your health needs are our priority.  As part of our continuing mission to provide you with exceptional heart care, we have created designated Provider Care Teams.  These Care Teams include your primary Cardiologist (physician) and Advanced Practice Providers (APPs -  Physician Assistants and Nurse Practitioners) who all work together to provide you with the care you need,  when you need it.  We recommend signing up for the patient portal called "MyChart".  Sign up information is provided on this After Visit Summary.  MyChart is used to connect with patients for Virtual Visits (Telemedicine).  Patients are able to view lab/test results, encounter notes, upcoming appointments, etc.  Non-urgent messages can be sent to your provider as well.   To learn more about what you can do with MyChart, go to [email or web address].    Your next appointment:   9 month(s)  The format for your next appointment:   In Person  Provider:   Belva Crome, MD   Other Instructions Echocardiogram An echocardiogram is a test that uses sound waves (ultrasound) to produce images of the heart. Images from an echocardiogram can provide important information about: Heart size and shape. The size and thickness and movement of your heart's walls. Heart muscle function and strength. Heart valve function or if you have stenosis. Stenosis is when the heart valves are too narrow. If blood is flowing backward through the heart valves (regurgitation). A tumor or infectious growth around the heart valves. Areas of heart muscle that are not working well because of poor blood flow or injury from a heart attack. Aneurysm detection. An aneurysm is a weak or damaged part of an artery wall. The wall bulges out from the normal force of blood pumping through the body. Tell a health care provider about: Any allergies you have. All medicines you are taking, including vitamins, herbs, eye drops, creams, and over-the-counter medicines. Any blood disorders  you have. Any surgeries you have had. Any medical conditions you have. Whether you are pregnant or may be pregnant. What are the risks? Generally, this is a safe test. However, problems may occur, including an allergic reaction to dye (contrast) that may be used during the test. What happens before the test? No specific preparation is needed. You  may eat and drink normally. What happens during the test? You will take off your clothes from the waist up and put on a hospital gown. Electrodes or electrocardiogram (ECG)patches may be placed on your chest. The electrodes or patches are then connected to a device that monitors your heart rate and rhythm. You will lie down on a table for an ultrasound exam. A gel will be applied to your chest to help sound waves pass through your skin. A handheld device, called a transducer, will be pressed against your chest and moved over your heart. The transducer produces sound waves that travel to your heart and bounce back (or "echo" back) to the transducer. These sound waves will be captured in real-time and changed into images of your heart that can be viewed on a video monitor. The images will be recorded on a computer and reviewed by your health care provider. You may be asked to change positions or hold your breath for a short time. This makes it easier to get different views or better views of your heart. In some cases, you may receive contrast through an IV in one of your veins. This can improve the quality of the pictures from your heart. The procedure may vary among health care providers and hospitals.   What can I expect after the test? You may return to your normal, everyday life, including diet, activities, and medicines, unless your health care provider tells you not to do that. Follow these instructions at home: It is up to you to get the results of your test. Ask your health care provider, or the department that is doing the test, when your results will be ready. Keep all follow-up visits. This is important. Summary An echocardiogram is a test that uses sound waves (ultrasound) to produce images of the heart. Images from an echocardiogram can provide important information about the size and shape of your heart, heart muscle function, heart valve function, and other possible heart problems. You  do not need to do anything to prepare before this test. You may eat and drink normally. After the echocardiogram is completed, you may return to your normal, everyday life, unless your health care provider tells you not to do that. This information is not intended to replace advice given to you by your health care provider. Make sure you discuss any questions you have with your health care provider. Document Revised: 03/17/2020 Document Reviewed: 03/17/2020 Elsevier Patient Education  2021 Elsevier Inc.   Important Information About Sugar

## 2023-05-15 ENCOUNTER — Other Ambulatory Visit: Payer: Self-pay

## 2023-05-15 MED ORDER — NEXLIZET 180-10 MG PO TABS: 1.0000 | ORAL_TABLET | Freq: Every day | ORAL | 3 refills | Status: DC

## 2023-05-19 ENCOUNTER — Ambulatory Visit: Payer: Federal, State, Local not specified - PPO | Attending: Cardiology

## 2023-05-19 DIAGNOSIS — E1151 Type 2 diabetes mellitus with diabetic peripheral angiopathy without gangrene: Secondary | ICD-10-CM | POA: Diagnosis not present

## 2023-05-19 DIAGNOSIS — Z951 Presence of aortocoronary bypass graft: Secondary | ICD-10-CM

## 2023-05-19 DIAGNOSIS — Z1321 Encounter for screening for nutritional disorder: Secondary | ICD-10-CM | POA: Diagnosis not present

## 2023-05-19 DIAGNOSIS — I2510 Atherosclerotic heart disease of native coronary artery without angina pectoris: Secondary | ICD-10-CM | POA: Diagnosis not present

## 2023-05-19 DIAGNOSIS — E782 Mixed hyperlipidemia: Secondary | ICD-10-CM | POA: Diagnosis not present

## 2023-05-20 LAB — ECHOCARDIOGRAM COMPLETE
Area-P 1/2: 4.44 cm2
S' Lateral: 3.3 cm

## 2023-05-20 LAB — COMPREHENSIVE METABOLIC PANEL
ALT: 41 [IU]/L — ABNORMAL HIGH (ref 0–32)
AST: 28 [IU]/L (ref 0–40)
Albumin: 4.7 g/dL (ref 3.8–4.9)
Alkaline Phosphatase: 76 [IU]/L (ref 44–121)
BUN/Creatinine Ratio: 16 (ref 9–23)
BUN: 14 mg/dL (ref 6–24)
Bilirubin Total: 0.5 mg/dL (ref 0.0–1.2)
CO2: 24 mmol/L (ref 20–29)
Calcium: 10.1 mg/dL (ref 8.7–10.2)
Chloride: 103 mmol/L (ref 96–106)
Creatinine, Ser: 0.88 mg/dL (ref 0.57–1.00)
Globulin, Total: 2.2 g/dL (ref 1.5–4.5)
Glucose: 102 mg/dL — ABNORMAL HIGH (ref 70–99)
Potassium: 4.9 mmol/L (ref 3.5–5.2)
Sodium: 142 mmol/L (ref 134–144)
Total Protein: 6.9 g/dL (ref 6.0–8.5)
eGFR: 76 mL/min/{1.73_m2} (ref >59)

## 2023-05-20 LAB — CBC
Hematocrit: 43.1 % (ref 34.0–46.6)
Hemoglobin: 14.3 g/dL (ref 11.1–15.9)
MCH: 33.1 pg — ABNORMAL HIGH (ref 26.6–33.0)
MCHC: 33.2 g/dL (ref 31.5–35.7)
MCV: 100 fL — ABNORMAL HIGH (ref 79–97)
Platelets: 307 10*3/uL (ref 150–450)
RBC: 4.32 x10E6/uL (ref 3.77–5.28)
RDW: 11.7 % (ref 11.7–15.4)
WBC: 5.5 10*3/uL (ref 3.4–10.8)

## 2023-05-20 LAB — LIPID PANEL
Chol/HDL Ratio: 3.7 {ratio} (ref 0.0–4.4)
Cholesterol, Total: 96 mg/dL — ABNORMAL LOW (ref 100–199)
HDL: 26 mg/dL — ABNORMAL LOW (ref >39)
LDL Chol Calc (NIH): 56 mg/dL (ref 0–99)
Triglycerides: 62 mg/dL (ref 0–149)
VLDL Cholesterol Cal: 14 mg/dL (ref 5–40)

## 2023-05-20 LAB — VITAMIN D 25 HYDROXY (VIT D DEFICIENCY, FRACTURES): Vit D, 25-Hydroxy: 34.4 ng/mL (ref 30.0–100.0)

## 2023-05-20 LAB — HEMOGLOBIN A1C
Est. average glucose Bld gHb Est-mCnc: 143 mg/dL
Hgb A1c MFr Bld: 6.6 % — ABNORMAL HIGH (ref 4.8–5.6)

## 2023-05-20 LAB — TSH: TSH: 19.6 u[IU]/mL — ABNORMAL HIGH (ref 0.450–4.500)

## 2023-05-23 ENCOUNTER — Telehealth: Payer: Self-pay

## 2023-05-23 ENCOUNTER — Telehealth: Payer: Self-pay | Admitting: Pharmacy Technician

## 2023-05-23 ENCOUNTER — Other Ambulatory Visit (HOSPITAL_COMMUNITY): Payer: Self-pay

## 2023-05-23 DIAGNOSIS — Z951 Presence of aortocoronary bypass graft: Secondary | ICD-10-CM

## 2023-05-23 DIAGNOSIS — I2510 Atherosclerotic heart disease of native coronary artery without angina pectoris: Secondary | ICD-10-CM

## 2023-05-23 NOTE — Telephone Encounter (Addendum)
Pharmacy Patient Advocate Encounter   Received notification from Pt Calls Messages that prior authorization for nexlizet is required/requested.   Insurance verification completed.   The patient is insured through [employer] FEP  .   Per test claim: PA required; PA submitted to above mentioned insurance via Fax Key/confirmation #/EOC fax Status is pending

## 2023-05-25 ENCOUNTER — Other Ambulatory Visit (HOSPITAL_COMMUNITY): Payer: Self-pay

## 2023-06-01 ENCOUNTER — Telehealth: Payer: Self-pay

## 2023-06-01 DIAGNOSIS — E782 Mixed hyperlipidemia: Secondary | ICD-10-CM

## 2023-06-01 DIAGNOSIS — Z951 Presence of aortocoronary bypass graft: Secondary | ICD-10-CM

## 2023-06-01 DIAGNOSIS — I2510 Atherosclerotic heart disease of native coronary artery without angina pectoris: Secondary | ICD-10-CM

## 2023-06-01 NOTE — Telephone Encounter (Signed)
-----   Message from Aundra Dubin Revankar sent at 06/01/2023  5:31 PM EDT ----- In that case refer to [facility] ----- Message ----- From: Eleonore Chiquito, RN Sent: 06/01/2023   5:19 PM EDT To: Garwin Brothers, MD  Pt states that her insurance will not cover Nexlizet. Pt states that she has been out for 1 week. Please advise. She is on 80 mg Atorvastatin.  Results reviewed with pt as per Dr. Kem Parkinson note.  Pt verbalized understanding and had no additional questions. Routed to PCP.

## 2023-06-16 ENCOUNTER — Other Ambulatory Visit (HOSPITAL_COMMUNITY): Payer: Self-pay

## 2023-06-20 ENCOUNTER — Other Ambulatory Visit (HOSPITAL_COMMUNITY): Payer: Self-pay

## 2023-06-26 ENCOUNTER — Other Ambulatory Visit (HOSPITAL_COMMUNITY): Payer: Self-pay

## 2023-06-27 ENCOUNTER — Other Ambulatory Visit (HOSPITAL_COMMUNITY): Payer: Self-pay

## 2023-06-27 ENCOUNTER — Other Ambulatory Visit: Payer: Self-pay | Admitting: Cardiology

## 2023-06-27 ENCOUNTER — Ambulatory Visit: Payer: Federal, State, Local not specified - PPO

## 2023-06-27 ENCOUNTER — Ambulatory Visit: Payer: Federal, State, Local not specified - PPO | Attending: Cardiology | Admitting: Pharmacist

## 2023-06-27 DIAGNOSIS — E782 Mixed hyperlipidemia: Secondary | ICD-10-CM

## 2023-06-27 DIAGNOSIS — I10 Essential (primary) hypertension: Secondary | ICD-10-CM

## 2023-06-27 DIAGNOSIS — I2102 ST elevation (STEMI) myocardial infarction involving left anterior descending coronary artery: Secondary | ICD-10-CM

## 2023-06-27 DIAGNOSIS — I2510 Atherosclerotic heart disease of native coronary artery without angina pectoris: Secondary | ICD-10-CM

## 2023-06-27 MED ORDER — NEXLIZET 180-10 MG PO TABS: 1.0000 | ORAL_TABLET | Freq: Every day | ORAL | Status: DC

## 2023-06-27 MED ORDER — LISINOPRIL 5 MG PO TABS: 5.0000 mg | ORAL_TABLET | Freq: Every day | ORAL | 1 refills | Status: DC

## 2023-06-27 NOTE — Telephone Encounter (Signed)
Pharmacy Patient Advocate Encounter  Received notification from [facility] FEP  that Prior Authorization for nexlizet has been DENIED.  Full denial letter will be uploaded to the media tab. See denial reason below. Must try and fail zetia, repatha, juxtapid before Nexlizet will be approved   PA #/Case ID/Reference #: letter attached in media

## 2023-06-27 NOTE — Progress Notes (Unsigned)
Patient ID: Maria Zuniga                 DOB: Jan 19, 1966                    MRN: [id number]     HPI: Maria Zuniga is a 57 y.o. female patient referred to lipid clinic by Dr Tomie China. PMH is significant for CAD, STEMI, CABG x 3, T2DM, PAD, HTN. Currently on atorvastatin 80mg   Patient presents today to discuss lipid management. Insurance plan denied coverage of Nexlizet. Currently on high intensity statin and Zetia.  Has quit smoking. Currently eats 1 meal per day and has eliminated sweets and is reducing carbohydrate intake.  Patient prefers non injectable medications. Patient's prescription drug plan requires use of Repatha before approving Nexlizet.  Current Medications:  Atorvastatin 80mg  daily Zetia 10mg  daily  Intolerances: N/A  Risk Factors:  CAD CABG PAD T2DM Low HDL Former smoker  LDL goal:<55  Labs: TC 96, Trigs 62, HDL 26, LDL 56 (05/19/23)  Past Medical History:  Diagnosis Date   Acute kidney insufficiency    CAD (coronary artery disease)    a. STEMI in 05/2019: LHC showed severe left main and ostial LAD disease s/p CABG x3 (LIMA to LAD, SVG to RI, SVG to LCX) on 05/17/2019     Coronary artery disease 05/17/2019   Coronary artery disease involving native heart without angina pectoris    Diabetes mellitus with peripheral vascular disease (HCC)    Elevated LFTs    HTN (hypertension) 05/11/2019   Hyperlipidemia    Hypothyroidism    Mixed dyslipidemia    PAD (peripheral artery disease) (HCC)    a. Dopplers in 06/2020: right ABI 1.09 and left ABI 0.63 with moderately elevated signal in her mid right SFA and occluded left common iliac artery - on Pletal   Peripheral arterial disease (HCC) 07/07/2020   Peripheral arterial disease     S/P CABG x 3 05/17/2019   LIMA to LAD  SVG to OM  SVG to RAMUS INTERMEDIATE     Senile purpura (HCC)    STEMI involving left anterior descending coronary artery (HCC) 05/11/2019    Current Outpatient Medications on File Prior to  Visit  Medication Sig Dispense Refill   aspirin EC 81 MG tablet Take 81 mg by mouth daily. Swallow whole.     atorvastatin (LIPITOR) 80 MG tablet Take 1 tablet (80 mg total) by mouth daily. 30 tablet 3   Bempedoic Acid-Ezetimibe (NEXLIZET) 180-10 MG TABS Take 1 tablet by mouth daily. 90 tablet 3   carvedilol (COREG) 6.25 MG tablet Take 1 tablet (6.25 mg total) by mouth 2 (two) times daily with a meal. Patient needs appointment for further refills. 1 st attempt 60 tablet 3   cilostazol (PLETAL) 100 MG tablet Take 100 mg by mouth daily.     fenofibrate 54 MG tablet Take 54 mg by mouth daily.     levothyroxine (SYNTHROID) 50 MCG tablet Take 1 tablet (50 mcg total) by mouth daily before breakfast. 30 tablet 3   lisinopril (ZESTRIL) 5 MG tablet Take 5 mg by mouth daily.     nitroGLYCERIN (NITROSTAT) 0.4 MG SL tablet Place 0.4 mg under the tongue every 5 (five) minutes as needed for chest pain.     No current facility-administered medications on file prior to visit.    No Known Allergies  Assessment/Plan:  1. Hyperlipidemia - Patient last LDL 56 which is slightly above goal of <55. Due to  numerous risk factors, prefer patient to be on PCSK9i however she does not want to self inject. Will give Nexlizet samples today and continue atorvastatin 80mg . Follow up as needed.  Continue atorvastatin 80mg  daily Continue Nexlizet 180-10mg  daily  Laural Golden, PharmD, BCACP, CDCES, CPP [address], [address] [place], [place], [address] Phone: [phone], Fax: [phone]

## 2023-06-27 NOTE — Patient Instructions (Addendum)
It was nice meeting you today  We would like your LDL (bad cholesterol) to be less than 55  Please continue your atorvastatin 80mg  once daily  I will contact your [employer] regarding your Nexlizet. I will supply you with some samples today  Please let us know if you have any questions  Laural Golden, PharmD, BCACP, CDCES, CPP [address], [address] [place], [place], [address] Phone: [phone], Fax: [phone]

## 2023-06-29 MED ORDER — EZETIMIBE 10 MG PO TABS: 10.0000 mg | ORAL_TABLET | Freq: Every day | ORAL | 3 refills | Status: DC

## 2023-06-29 NOTE — Addendum Note (Signed)
Addended by: Eleonore Chiquito on: 06/29/2023 04:30 PM   Modules accepted: Orders

## 2023-06-29 NOTE — Telephone Encounter (Signed)
Pt wanted to try zetia and will have labs rechecked 6 weeks.

## 2023-07-25 ENCOUNTER — Other Ambulatory Visit: Payer: Self-pay | Admitting: Cardiology

## 2023-08-04 ENCOUNTER — Encounter: Payer: Self-pay | Admitting: Pharmacist

## 2023-08-09 ENCOUNTER — Other Ambulatory Visit: Payer: Self-pay | Admitting: Cardiology

## 2023-09-13 DIAGNOSIS — E039 Hypothyroidism, unspecified: Secondary | ICD-10-CM | POA: Diagnosis not present

## 2023-09-13 DIAGNOSIS — I10 Essential (primary) hypertension: Secondary | ICD-10-CM | POA: Diagnosis not present

## 2023-09-13 DIAGNOSIS — R609 Edema, unspecified: Secondary | ICD-10-CM | POA: Diagnosis not present

## 2023-09-13 DIAGNOSIS — E782 Mixed hyperlipidemia: Secondary | ICD-10-CM | POA: Diagnosis not present

## 2023-09-13 DIAGNOSIS — N189 Chronic kidney disease, unspecified: Secondary | ICD-10-CM | POA: Diagnosis not present

## 2023-09-13 DIAGNOSIS — I2510 Atherosclerotic heart disease of native coronary artery without angina pectoris: Secondary | ICD-10-CM | POA: Diagnosis not present

## 2023-11-30 DIAGNOSIS — N189 Chronic kidney disease, unspecified: Secondary | ICD-10-CM | POA: Diagnosis not present

## 2023-11-30 DIAGNOSIS — E039 Hypothyroidism, unspecified: Secondary | ICD-10-CM | POA: Diagnosis not present

## 2023-12-15 ENCOUNTER — Other Ambulatory Visit: Payer: Self-pay | Admitting: Cardiology

## 2023-12-15 DIAGNOSIS — I10 Essential (primary) hypertension: Secondary | ICD-10-CM

## 2023-12-15 NOTE — Telephone Encounter (Signed)
 Rx refill sent to pharmacy.

## 2024-02-20 ENCOUNTER — Ambulatory Visit: Attending: Cardiology | Admitting: Cardiology

## 2024-02-20 ENCOUNTER — Encounter: Payer: Self-pay | Admitting: Cardiology

## 2024-02-20 VITALS — BP 90/52 | HR 63 | Ht 62.0 in | Wt 145.4 lb

## 2024-02-20 DIAGNOSIS — E1151 Type 2 diabetes mellitus with diabetic peripheral angiopathy without gangrene: Secondary | ICD-10-CM | POA: Diagnosis not present

## 2024-02-20 DIAGNOSIS — I10 Essential (primary) hypertension: Secondary | ICD-10-CM

## 2024-02-20 DIAGNOSIS — E782 Mixed hyperlipidemia: Secondary | ICD-10-CM

## 2024-02-20 DIAGNOSIS — I2510 Atherosclerotic heart disease of native coronary artery without angina pectoris: Secondary | ICD-10-CM | POA: Diagnosis not present

## 2024-02-20 DIAGNOSIS — Z951 Presence of aortocoronary bypass graft: Secondary | ICD-10-CM

## 2024-02-20 NOTE — Patient Instructions (Signed)

## 2024-02-20 NOTE — Progress Notes (Signed)
 Cardiology Office Note:    Date:  02/20/2024   ID:  Maria Zuniga, DOB 1965-12-12, MRN [id number]  PCP:  Benjamine Lauraine DASEN, NP  Cardiologist:  Jennifer JONELLE Crape, MD   Referring MD: Trinidad Glisson, MD    ASSESSMENT:    1. Coronary artery disease involving native coronary artery of native heart without angina pectoris   2. Diabetes mellitus with peripheral vascular disease (HCC)   3. Primary hypertension   4. Mixed dyslipidemia   5. S/P CABG x 3    PLAN:    In order of problems listed above:  Coronary artery disease: Secondary prevention stressed to the patient.  Importance of compliance with diet medication stressed any vocalized understanding.  She is doing well with exercise and happy about it. Essential hypertension: Blood pressure is stable and diet was emphasized.  Blood pressure is borderline but she is asymptomatic so I will not change any of her medications.  I told her to keep herself adequately hydrated. Mixed dyslipidemia: On lipid-lowering medications followed by primary care.  Goal LDL must be less than 60.  K PN sheet was reviewed and discussed with Patient will be seen in follow-up appointment in 6 months or earlier if the patient has any concerns.    Medication Adjustments/Labs and Tests Ordered: Current medicines are reviewed at length with the patient today.  Concerns regarding medicines are outlined above.  No orders of the defined types were placed in this encounter.  No orders of the defined types were placed in this encounter.    No chief complaint on file.    History of Present Illness:    Maria Zuniga is a 58 y.o. female.  Patient has past medical history of coronary artery disease post CABG surgery, essential hypertension, mixed dyslipidemia and diabetes mellitus.  She denies any problems at this time and takes care of activities of daily living.  No chest pain orthopnea or PND.  She exercises very regularly.  She even goes hiking on mountains  without any symptoms.  She does carry nitroglycerin  with her at all times.  At the time of my evaluation, the patient is alert awake oriented and in no distress.  Past Medical History:  Diagnosis Date   Acute kidney insufficiency    CAD (coronary artery disease)    a. STEMI in 05/2019: LHC showed severe left main and ostial LAD disease s/p CABG x3 (LIMA to LAD, SVG to RI, SVG to LCX) on 05/17/2019     Coronary artery disease 05/17/2019   Coronary artery disease involving native heart without angina pectoris    Diabetes mellitus with peripheral vascular disease (HCC)    Elevated LFTs    HTN (hypertension) 05/11/2019   Hyperlipidemia    Hypothyroidism    Mixed dyslipidemia    PAD (peripheral artery disease) (HCC)    a. Dopplers in 06/2020: right ABI 1.09 and left ABI 0.63 with moderately elevated signal in her mid right SFA and occluded left common iliac artery - on Pletal    Peripheral arterial disease (HCC) 07/07/2020   Peripheral arterial disease     S/P CABG x 3 05/17/2019   LIMA to LAD  SVG to OM  SVG to RAMUS INTERMEDIATE     Senile purpura (HCC)    STEMI involving left anterior descending coronary artery (HCC) 05/11/2019    Past Surgical History:  Procedure Laterality Date   CORONARY ARTERY BYPASS GRAFT N/A 05/17/2019   Procedure: CORONARY ARTERY BYPASS GRAFTING (CABG) x Three, using left internal  mammary artery and right leg greater saphenous vein harvested endoscopically;  Surgeon: Army Dallas NOVAK, MD;  Location: [facility] OR;  Service: Open Heart Surgery;  Laterality: N/A;   HIP SURGERY Right 1996   KNEE SURGERY     LEFT HEART CATH AND CORONARY ANGIOGRAPHY N/A 05/11/2019   Procedure: LEFT HEART CATH AND CORONARY ANGIOGRAPHY;  Surgeon: [place], Peter M, MD;  Location: [facility] INVASIVE CV LAB;  Service: Cardiovascular;  Laterality: N/A;   TEE WITHOUT CARDIOVERSION N/A 05/17/2019   Procedure: TRANSESOPHAGEAL ECHOCARDIOGRAM (TEE);  Surgeon: Army Dallas NOVAK, MD;  Location: [facility] OR;   Service: Open Heart Surgery;  Laterality: N/A;   TUBAL LIGATION      Current Medications: Current Meds  Medication Sig   aspirin  EC 81 MG tablet Take 81 mg by mouth daily. Swallow whole.   atorvastatin  (LIPITOR ) 80 MG tablet Take 1 tablet by mouth once daily   carvedilol  (COREG ) 6.25 MG tablet Take 1 tablet (6.25 mg total) by mouth 2 (two) times daily with a meal.   ezetimibe  (ZETIA ) 10 MG tablet Take 1 tablet (10 mg total) by mouth daily.   levothyroxine  (SYNTHROID ) 50 MCG tablet Take 1 tablet (50 mcg total) by mouth daily before breakfast.   lisinopril  (ZESTRIL ) 5 MG tablet Take 1 tablet by mouth once daily   nitroGLYCERIN  (NITROSTAT ) 0.4 MG SL tablet Place 0.4 mg under the tongue every 5 (five) minutes as needed for chest pain.     Allergies:   Patient has no known allergies.   Social History   Socioeconomic History   Marital status: Married    Spouse name: Not on file   Number of children: Not on file   Years of education: Not on file   Highest education level: Not on file  Occupational History   Occupation: mail carrier  Tobacco Use   Smoking status: Former    Types: Cigarettes    Start date: 05/10/2018    Passive exposure: Past   Smokeless tobacco: Never  Vaping Use   Vaping status: Never Used  Substance and Sexual Activity   Alcohol use: Not Currently   Drug use: Never   Sexual activity: Not on file  Other Topics Concern   Not on file  Social History Narrative   Not on file   Social Drivers of Health   Financial Resource Strain: Not on file  Food Insecurity: Not on file  Transportation Needs: Not on file  Physical Activity: Not on file  Stress: Not on file  Social Connections: Not on file     Family History: The patient's family history includes CAD in her mother; Cancer in her father; Heart disease in her brother, maternal grandfather, maternal grandmother, and paternal grandmother; Pancreatic cancer in her brother; Thyroid  disease in her mother.  ROS:    Please see the history of present illness.    All other systems reviewed and are negative.  EKGs/Labs/Other Studies Reviewed:    The following studies were reviewed today: .SABRA   I discussed my findings with the patient at length   Recent Labs: 05/19/2023: ALT 41; BUN 14; Creatinine, Ser 0.88; Hemoglobin 14.3; Platelets 307; Potassium 4.9; Sodium 142; TSH 19.600  Recent Lipid Panel    Component Value Date/Time   CHOL 96 (L) 05/19/2023 1308   TRIG 62 05/19/2023 1308   HDL 26 (L) 05/19/2023 1308   CHOLHDL 3.7 05/19/2023 1308   CHOLHDL 3.7 05/11/2019 1337   VLDL 14 05/11/2019 1337   LDLCALC 56 05/19/2023 1308  Physical Exam:    VS:  BP (!) 90/52   Pulse 63   Ht 5' 2 (1.575 m)   Wt 145 lb 6.4 oz (66 kg)   SpO2 98%   BMI 26.59 kg/m     Wt Readings from Last 3 Encounters:  02/20/24 145 lb 6.4 oz (66 kg)  05/10/23 159 lb (72.1 kg)  12/17/21 154 lb (69.9 kg)     GEN: Patient is in no acute distress HEENT: Normal NECK: No JVD; No carotid bruits LYMPHATICS: No lymphadenopathy CARDIAC: Hear sounds regular, 2/6 systolic murmur at the apex. RESPIRATORY:  Clear to auscultation without rales, wheezing or rhonchi  ABDOMEN: Soft, non-tender, non-distended MUSCULOSKELETAL:  No edema; No deformity  SKIN: Warm and dry NEUROLOGIC:  Alert and oriented x 3 PSYCHIATRIC:  Normal affect   Signed, Jennifer JONELLE Crape, MD  02/20/2024 4:13 PM    [facility]

## 2024-06-15 ENCOUNTER — Other Ambulatory Visit: Payer: Self-pay | Admitting: Cardiology

## 2024-06-15 DIAGNOSIS — I10 Essential (primary) hypertension: Secondary | ICD-10-CM

## 2024-07-20 ENCOUNTER — Other Ambulatory Visit: Payer: Self-pay | Admitting: Cardiology

## 2024-08-03 ENCOUNTER — Other Ambulatory Visit: Payer: Self-pay | Admitting: Cardiology
